# Patient Record
Sex: Female | Born: 1950
Health system: Southern US, Community
[De-identification: ages and names within clinical notes are randomized; demographics above are authoritative.]

## PROBLEM LIST (undated history)

## (undated) DIAGNOSIS — F329 Major depressive disorder, single episode, unspecified: Secondary | ICD-10-CM

## (undated) DIAGNOSIS — M797 Fibromyalgia: Secondary | ICD-10-CM

## (undated) DIAGNOSIS — J449 Chronic obstructive pulmonary disease, unspecified: Secondary | ICD-10-CM

## (undated) DIAGNOSIS — M1990 Unspecified osteoarthritis, unspecified site: Secondary | ICD-10-CM

## (undated) DIAGNOSIS — R42 Dizziness and giddiness: Secondary | ICD-10-CM

## (undated) DIAGNOSIS — F32A Depression, unspecified: Secondary | ICD-10-CM

## (undated) DIAGNOSIS — T7840XA Allergy, unspecified, initial encounter: Secondary | ICD-10-CM

## (undated) DIAGNOSIS — M810 Age-related osteoporosis without current pathological fracture: Secondary | ICD-10-CM

## (undated) DIAGNOSIS — F419 Anxiety disorder, unspecified: Secondary | ICD-10-CM

## (undated) HISTORY — DX: Age-related osteoporosis without current pathological fracture: M81.0

## (undated) HISTORY — DX: Fibromyalgia: M79.7

## (undated) HISTORY — DX: Unspecified osteoarthritis, unspecified site: M19.90

## (undated) HISTORY — DX: Chronic obstructive pulmonary disease, unspecified: J44.9

## (undated) HISTORY — DX: Depression, unspecified: F32.A

## (undated) HISTORY — DX: Dizziness and giddiness: R42

## (undated) HISTORY — DX: Major depressive disorder, single episode, unspecified: F32.9

## (undated) HISTORY — DX: Allergy, unspecified, initial encounter: T78.40XA

## (undated) HISTORY — DX: Anxiety disorder, unspecified: F41.9

---

## 1972-03-01 HISTORY — PX: TUBAL LIGATION: SHX77

## 2012-10-23 LAB — HM MAMMOGRAPHY

## 2012-11-21 DIAGNOSIS — Z8669 Personal history of other diseases of the nervous system and sense organs: Secondary | ICD-10-CM | POA: Diagnosis not present

## 2012-11-21 DIAGNOSIS — M549 Dorsalgia, unspecified: Secondary | ICD-10-CM | POA: Diagnosis not present

## 2012-11-21 DIAGNOSIS — R509 Fever, unspecified: Secondary | ICD-10-CM | POA: Diagnosis not present

## 2012-11-21 DIAGNOSIS — R05 Cough: Secondary | ICD-10-CM | POA: Diagnosis not present

## 2013-02-12 DIAGNOSIS — F17200 Nicotine dependence, unspecified, uncomplicated: Secondary | ICD-10-CM | POA: Diagnosis not present

## 2013-02-12 DIAGNOSIS — J329 Chronic sinusitis, unspecified: Secondary | ICD-10-CM | POA: Diagnosis not present

## 2013-05-07 ENCOUNTER — Ambulatory Visit (INDEPENDENT_AMBULATORY_CARE_PROVIDER_SITE_OTHER): Payer: Medicare Other

## 2013-05-07 ENCOUNTER — Ambulatory Visit (INDEPENDENT_AMBULATORY_CARE_PROVIDER_SITE_OTHER): Payer: Medicare Other | Admitting: Family Medicine

## 2013-05-07 VITALS — BP 98/45 | HR 109 | Temp 98.3°F | Ht 64.0 in | Wt 143.4 lb

## 2013-05-07 DIAGNOSIS — N390 Urinary tract infection, site not specified: Secondary | ICD-10-CM | POA: Diagnosis not present

## 2013-05-07 DIAGNOSIS — R42 Dizziness and giddiness: Secondary | ICD-10-CM

## 2013-05-07 DIAGNOSIS — R509 Fever, unspecified: Secondary | ICD-10-CM

## 2013-05-07 DIAGNOSIS — R52 Pain, unspecified: Secondary | ICD-10-CM

## 2013-05-07 LAB — POCT CBC
Granulocyte percent: 76.5 %G (ref 37–80)
HCT, POC: 44.2 % (ref 37.7–47.9)
Hemoglobin: 14.5 g/dL (ref 12.2–16.2)
Lymph, poc: 2 (ref 0.6–3.4)
MCH, POC: 30.7 pg (ref 27–31.2)
MCHC: 32.8 g/dL (ref 31.8–35.4)
MCV: 93.7 fL (ref 80–97)
MPV: 8.2 fL (ref 0–99.8)
POC Granulocyte: 7.2 — AB (ref 2–6.9)
POC LYMPH PERCENT: 21 %L (ref 10–50)
Platelet Count, POC: 279 10*3/uL (ref 142–424)
RBC: 4.7 M/uL (ref 4.04–5.48)
RDW, POC: 12.2 %
WBC: 9.4 10*3/uL (ref 4.6–10.2)

## 2013-05-07 LAB — POCT UA - MICROSCOPIC ONLY
Casts, Ur, LPF, POC: NEGATIVE
Crystals, Ur, HPF, POC: NEGATIVE
Mucus, UA: NEGATIVE
Yeast, UA: NEGATIVE

## 2013-05-07 LAB — POCT URINALYSIS DIPSTICK
Bilirubin, UA: NEGATIVE
Blood, UA: NEGATIVE
Glucose, UA: NEGATIVE
Ketones, UA: NEGATIVE
Nitrite, UA: NEGATIVE
Protein, UA: NEGATIVE
Spec Grav, UA: <=1.005
Urobilinogen, UA: NEGATIVE
pH, UA: 7.5

## 2013-05-07 LAB — POCT INFLUENZA A/B
Influenza A, POC: NEGATIVE
Influenza B, POC: NEGATIVE

## 2013-05-07 LAB — POCT UA - MICROALBUMIN

## 2013-05-07 MED ORDER — CIPROFLOXACIN HCL 500 MG PO TABS: 500.0000 mg | ORAL_TABLET | Freq: Two times a day (BID) | ORAL | Status: DC

## 2013-05-07 MED ORDER — MECLIZINE HCL 25 MG PO TABS: 25.0000 mg | ORAL_TABLET | Freq: Three times a day (TID) | ORAL | Status: DC | PRN

## 2013-05-07 NOTE — Progress Notes (Signed)
   Subjective:    Patient ID: Lindsey May, female    DOB: 1950/09/08, 63 y.o.   MRN: [id number]  HPI This 63 y.o. female presents for evaluation of fever, malaise, and cough   Review of Systems    No chest pain, SOB, HA, dizziness, vision change, N/V, diarrhea, constipation, dysuria, urinary urgency or frequency, myalgias, arthralgias or rash.  Objective:   Physical Exam  Vital signs noted  Well developed well nourished female.  HEENT - Head atraumatic Normocephalic                Eyes - PERRLA, Conjuctiva - clear Sclera- Clear EOMI                Ears - EAC's Wnl TM's Wnl Gross Hearing WNL                Nose - Nares patent                 Throat - oropharanx wnl Respiratory - Lungs CTA bilateral Cardiac - RRR S1 and S2 without murmur GI - Abdomen soft Nontender and bowel sounds active x 4 Extremities - No edema. Neuro - Grossly intact.  Results for orders placed in visit on 05/07/13  POCT INFLUENZA A/B      Result Value Ref Range   Influenza A, POC Negative     Influenza B, POC Negative    POCT UA - MICROALBUMIN      Result Value Ref Range   Microalbumin Ur, POC      POCT UA - MICROSCOPIC ONLY      Result Value Ref Range   WBC, Ur, HPF, POC -     RBC, urine, microscopic occ     Bacteria, U Microscopic occ     Mucus, UA negative     Epithelial cells, urine per micros occ     Crystals, Ur, HPF, POC negative     Casts, Ur, LPF, POC negative     Yeast, UA negative    POCT URINALYSIS DIPSTICK      Result Value Ref Range   Color, UA gold     Clarity, UA clear     Glucose, UA negative     Bilirubin, UA negative     Ketones, UA negative     Spec Grav, UA <=1.005     Blood, UA negative     pH, UA 7.5     Protein, UA negative     Urobilinogen, UA negative     Nitrite, UA negative     Leukocytes, UA large (3+)       CXR - Normal Assessment & Plan:  Fever - Plan: POCT Influenza A/B, POCT UA - Microalbumin, POCT UA - Microscopic Only, POCT CBC, CMP14+EGFR, Thyroid  Panel With TSH, DG Chest 2 View  Body aches - Plan: POCT Influenza A/B, POCT UA - Microalbumin, POCT UA - Microscopic Only, POCT CBC, CMP14+EGFR, Thyroid Panel With TSH, DG Chest 2 View.  UTI - UA cx and cipro 500 mg po bid x 10 days  Push po fluids, rest, tylenol and motrin otc prn as directed for fever, arthralgias, and myalgias.  Follow up prn if sx's continue or persist.  Vertigo - Plan: meclizine (ANTIVERT) 25 MG tablet  Lysbeth Penner FNP

## 2013-05-08 LAB — THYROID PANEL WITH TSH
Free Thyroxine Index: 2.7 (ref 1.2–4.9)
T3 Uptake Ratio: 26 % (ref 24–39)
T4, Total: 10.3 ug/dL (ref 4.5–12.0)
TSH: 0.979 u[IU]/mL (ref 0.450–4.500)

## 2013-05-08 LAB — URINE CULTURE: Organism ID, Bacteria: NO GROWTH

## 2013-05-08 LAB — CMP14+EGFR
ALT: 10 IU/L (ref 0–32)
AST: 13 IU/L (ref 0–40)
Albumin/Globulin Ratio: 2.1 (ref 1.1–2.5)
Albumin: 4.5 g/dL (ref 3.6–4.8)
Alkaline Phosphatase: 113 IU/L (ref 39–117)
BUN/Creatinine Ratio: 12 (ref 11–26)
BUN: 11 mg/dL (ref 8–27)
CO2: 27 mmol/L (ref 18–29)
Calcium: 9.6 mg/dL (ref 8.7–10.3)
Chloride: 94 mmol/L — ABNORMAL LOW (ref 97–108)
Creatinine, Ser: 0.94 mg/dL (ref 0.57–1.00)
GFR calc Af Amer: 74 mL/min/{1.73_m2} (ref >59)
GFR calc non Af Amer: 64 mL/min/{1.73_m2} (ref >59)
Globulin, Total: 2.1 g/dL (ref 1.5–4.5)
Glucose: 88 mg/dL (ref 65–99)
Potassium: 4.6 mmol/L (ref 3.5–5.2)
Sodium: 139 mmol/L (ref 134–144)
Total Bilirubin: 0.4 mg/dL (ref 0.0–1.2)
Total Protein: 6.6 g/dL (ref 6.0–8.5)

## 2013-06-18 ENCOUNTER — Ambulatory Visit (INDEPENDENT_AMBULATORY_CARE_PROVIDER_SITE_OTHER): Payer: Medicare Other | Admitting: Family Medicine

## 2013-06-18 ENCOUNTER — Encounter: Payer: Self-pay | Admitting: Family Medicine

## 2013-06-18 VITALS — BP 110/78 | HR 74 | Temp 97.6°F | Ht 64.0 in | Wt 145.8 lb

## 2013-06-18 DIAGNOSIS — J069 Acute upper respiratory infection, unspecified: Secondary | ICD-10-CM | POA: Diagnosis not present

## 2013-06-18 DIAGNOSIS — F411 Generalized anxiety disorder: Secondary | ICD-10-CM

## 2013-06-18 DIAGNOSIS — H109 Unspecified conjunctivitis: Secondary | ICD-10-CM | POA: Diagnosis not present

## 2013-06-18 MED ORDER — POLYMYXIN B-TRIMETHOPRIM 10000-0.1 UNIT/ML-% OP SOLN: 1.0000 [drp] | OPHTHALMIC | Status: DC

## 2013-06-18 MED ORDER — AMOXICILLIN 875 MG PO TABS
875.0000 mg | ORAL_TABLET | Freq: Two times a day (BID) | ORAL | Status: DC
Start: 2013-06-18 — End: 2013-07-13

## 2013-06-18 MED ORDER — LORAZEPAM 0.5 MG PO TABS: 0.5000 mg | ORAL_TABLET | Freq: Two times a day (BID) | ORAL | Status: DC | PRN

## 2013-06-18 MED ORDER — METHYLPREDNISOLONE ACETATE 80 MG/ML IJ SUSP
80.0000 mg | Freq: Once | INTRAMUSCULAR | Status: AC
  Administered 2013-06-18: 80 mg via INTRAMUSCULAR
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
## 2013-07-11 ENCOUNTER — Telehealth: Payer: Self-pay | Admitting: *Deleted

## 2013-07-11 ENCOUNTER — Other Ambulatory Visit: Payer: Self-pay | Admitting: Family Medicine

## 2013-07-11 DIAGNOSIS — R42 Dizziness and giddiness: Secondary | ICD-10-CM

## 2013-07-11 MED ORDER — MECLIZINE HCL 25 MG PO TABS: 25.0000 mg | ORAL_TABLET | Freq: Three times a day (TID) | ORAL | Status: DC | PRN

## 2013-07-11 NOTE — Telephone Encounter (Signed)
Patient wants to know if we can refill on prescription for antivert

## 2013-07-13 ENCOUNTER — Ambulatory Visit (INDEPENDENT_AMBULATORY_CARE_PROVIDER_SITE_OTHER): Payer: Medicare Other | Admitting: Family Medicine

## 2013-07-13 ENCOUNTER — Encounter: Payer: Self-pay | Admitting: Family Medicine

## 2013-07-13 VITALS — BP 102/69 | HR 77 | Temp 98.8°F | Ht 64.0 in | Wt 144.0 lb

## 2013-07-13 DIAGNOSIS — K130 Diseases of lips: Secondary | ICD-10-CM

## 2013-07-13 DIAGNOSIS — H109 Unspecified conjunctivitis: Secondary | ICD-10-CM | POA: Diagnosis not present

## 2013-07-13 DIAGNOSIS — R42 Dizziness and giddiness: Secondary | ICD-10-CM | POA: Diagnosis not present

## 2013-07-13 DIAGNOSIS — J069 Acute upper respiratory infection, unspecified: Secondary | ICD-10-CM | POA: Diagnosis not present

## 2013-07-13 MED ORDER — NYSTATIN 100000 UNIT/GM EX OINT: 1.0000 "application " | TOPICAL_OINTMENT | Freq: Two times a day (BID) | CUTANEOUS | Status: DC

## 2013-07-13 MED ORDER — METHYLPREDNISOLONE ACETATE 80 MG/ML IJ SUSP
80.0000 mg | Freq: Once | INTRAMUSCULAR | Status: AC
  Administered 2013-07-13: 80 mg via INTRAMUSCULAR

## 2013-07-13 MED ORDER — MECLIZINE HCL 25 MG PO TABS: 25.0000 mg | ORAL_TABLET | Freq: Three times a day (TID) | ORAL | Status: DC | PRN

## 2013-07-13 MED ORDER — AMOXICILLIN 875 MG PO TABS: 875.0000 mg | ORAL_TABLET | Freq: Two times a day (BID) | ORAL | Status: DC

## 2013-07-13 NOTE — Progress Notes (Signed)
   Subjective:    Patient ID: Lindsey LevelAlma May, female    DOB: 11/25/1950, 63 y.o.   MRN: [id number]  HPI This 63 y.o. female presents for evaluation of URI sx's, dizziness, and c/o rash on edge of mouth that has been persistent.    Review of Systems C/o uri sx's and rash No chest pain, SOB, HA, dizziness, vision change, N/V, diarrhea, constipation, dysuria, urinary urgency or frequency, myalgias, arthralgias or rash.     Objective:   Physical Exam Vital signs noted  Well developed well nourished female.  HEENT - Head atraumatic Normocephalic                Eyes - PERRLA, Conjuctiva - clear Sclera- Clear EOMI                Ears - EAC's Wnl TM's Wnl Gross Hearing WNL                Throat - oropharanx wnl Respiratory - Lungs CTA bilateral Cardiac - RRR S1 and S2 without murmur GI - Abdomen soft Nontender and bowel sounds active x 4 Extremities - No edema. Neuro - Grossly intact. Skin - rash on edge of mouth      Assessment & Plan:  URI (upper respiratory infection) - Plan: amoxicillin (AMOXIL) 875 MG tablet, methylPREDNISolone acetate (DEPO-MEDROL) injection 80 mg  Vertigo - Plan: meclizine (ANTIVERT) 25 MG tablet  Angular cheilitis - Plan: nystatin ointment (MYCOSTATIN) apply bid  Deatra CanterWilliam J Weda Baumgarner FNP

## 2013-07-19 NOTE — Telephone Encounter (Signed)
Patient seen on 5/15

## 2013-08-17 ENCOUNTER — Other Ambulatory Visit: Payer: Self-pay | Admitting: Nurse Practitioner

## 2013-08-17 ENCOUNTER — Ambulatory Visit (INDEPENDENT_AMBULATORY_CARE_PROVIDER_SITE_OTHER): Payer: Medicare Other | Admitting: Nurse Practitioner

## 2013-08-17 VITALS — BP 118/78 | HR 76 | Temp 98.3°F | Ht 64.0 in | Wt 142.6 lb

## 2013-08-17 DIAGNOSIS — R42 Dizziness and giddiness: Secondary | ICD-10-CM

## 2013-08-17 DIAGNOSIS — J3089 Other allergic rhinitis: Secondary | ICD-10-CM | POA: Diagnosis not present

## 2013-08-17 DIAGNOSIS — M545 Low back pain, unspecified: Secondary | ICD-10-CM

## 2013-08-17 DIAGNOSIS — J302 Other seasonal allergic rhinitis: Secondary | ICD-10-CM

## 2013-08-17 MED ORDER — MECLIZINE HCL 25 MG PO TABS: 25.0000 mg | ORAL_TABLET | Freq: Three times a day (TID) | ORAL | Status: DC | PRN

## 2013-08-17 MED ORDER — METHYLPREDNISOLONE ACETATE 80 MG/ML IJ SUSP
80.0000 mg | Freq: Once | INTRAMUSCULAR | Status: AC
  Administered 2013-08-17: 80 mg via INTRAMUSCULAR

## 2013-08-17 NOTE — Progress Notes (Signed)
   Subjective:    Patient ID: Lindsey LevelAlma May, female    DOB: Jul 26, 1950, 63 y.o.   MRN: [id number]  HPI Patient in c/o: - headache and right ear pain- started about 2-3 days ago- she denies fever. -Back pain started 1 week ago- intermittent- sitting makes it worse and walking relieves pain- denies any injury- Pian radiates down the back of her left leg- denies any weakness. Rates pain 6-7/10 currently and took tylenol about 30  Minutes ago.    Review of Systems  Constitutional: Negative for fever and chills.  HENT: Positive for congestion, ear pain (right) and sneezing.   Respiratory: Negative.   Cardiovascular: Negative.   Gastrointestinal: Negative.   Genitourinary: Negative.   Musculoskeletal: Positive for back pain.  Neurological: Negative.   Psychiatric/Behavioral: Negative.   All other systems reviewed and are negative.      Objective:   Physical Exam  Constitutional: She is oriented to person, place, and time. She appears well-developed and well-nourished.  HENT:  Right Ear: Hearing, tympanic membrane, external ear and ear canal normal.  Left Ear: Hearing, tympanic membrane, external ear and ear canal normal.  Nose: Mucosal edema and rhinorrhea present. Right sinus exhibits no maxillary sinus tenderness and no frontal sinus tenderness. Left sinus exhibits no maxillary sinus tenderness and no frontal sinus tenderness.  Mouth/Throat: Uvula is midline, oropharynx is clear and moist and mucous membranes are normal.  Cardiovascular: Normal rate, regular rhythm and normal heart sounds.   Pulmonary/Chest: Effort normal and breath sounds normal.  Abdominal: Soft. Bowel sounds are normal.  Musculoskeletal:  FROM of lumbar spine - withpain on palpation left lower back (-) SLR bil Motor strength and sensation distally intact  Neurological: She is alert and oriented to person, place, and time. She has normal reflexes.  Skin: Skin is warm and dry.  Psychiatric: She has a normal mood  and affect. Her behavior is normal. Judgment and thought content normal.   BP 118/78  Pulse 76  Temp(Src) 98.3 F (36.8 C) (Oral)  Ht 5\' 4"  (1.626 m)  Wt 142 lb 9.6 oz (64.683 kg)  BMI 24.47 kg/m2        Assessment & Plan:  1. Left-sided low back pain without sciatica Moist heat ICY hot OTC Back stretching exercises - methylPREDNISolone acetate (DEPO-MEDROL) injection 80 mg; Inject 1 mL (80 mg total) into the muscle once.  2. Other seasonal allergic rhinitis Prednisone injection will help Continue OTC nasal sray Zyrtec if helps Force fluids RTO prn  Mary-Margaret Daphine DeutscherMartin, FNP

## 2013-08-17 NOTE — Patient Instructions (Signed)

## 2013-08-23 ENCOUNTER — Encounter: Payer: Self-pay | Admitting: Family Medicine

## 2013-08-23 ENCOUNTER — Ambulatory Visit (INDEPENDENT_AMBULATORY_CARE_PROVIDER_SITE_OTHER): Payer: Medicare Other | Admitting: Family Medicine

## 2013-08-23 VITALS — BP 125/58 | HR 89 | Temp 97.2°F

## 2013-08-23 DIAGNOSIS — M545 Low back pain, unspecified: Secondary | ICD-10-CM

## 2013-08-23 DIAGNOSIS — F411 Generalized anxiety disorder: Secondary | ICD-10-CM

## 2013-08-23 DIAGNOSIS — F3289 Other specified depressive episodes: Secondary | ICD-10-CM

## 2013-08-23 DIAGNOSIS — J069 Acute upper respiratory infection, unspecified: Secondary | ICD-10-CM

## 2013-08-23 DIAGNOSIS — F329 Major depressive disorder, single episode, unspecified: Secondary | ICD-10-CM | POA: Diagnosis not present

## 2013-08-23 DIAGNOSIS — F32A Depression, unspecified: Secondary | ICD-10-CM

## 2013-08-23 MED ORDER — CYCLOBENZAPRINE HCL 10 MG PO TABS: 10.0000 mg | ORAL_TABLET | Freq: Three times a day (TID) | ORAL | Status: DC | PRN

## 2013-08-23 MED ORDER — NAPROXEN 500 MG PO TABS: 500.0000 mg | ORAL_TABLET | Freq: Two times a day (BID) | ORAL | Status: DC

## 2013-08-23 MED ORDER — LORAZEPAM 0.5 MG PO TABS: 0.5000 mg | ORAL_TABLET | Freq: Three times a day (TID) | ORAL | Status: DC | PRN

## 2013-08-23 MED ORDER — SERTRALINE HCL 50 MG PO TABS: 50.0000 mg | ORAL_TABLET | Freq: Every day | ORAL | Status: DC

## 2013-08-23 MED ORDER — AMOXICILLIN 875 MG PO TABS: 875.0000 mg | ORAL_TABLET | Freq: Two times a day (BID) | ORAL | Status: DC

## 2013-08-23 NOTE — Progress Notes (Signed)
   Subjective:    Patient ID: Lindsey May, female    DOB: [birth year], 63 y.o.   MRN: [id number]  HPI This 63 y.o. female presents for evaluation of c/o back pain and depression sx's.  She states her Son is on dialysis and he is dying from liver cancer.  She needs refills on her ativan.   Review of Systems C/o back pain and depression   No chest pain, SOB, HA, dizziness, vision change, N/V, diarrhea, constipation, dysuria, urinary urgency or frequency, myalgias, arthralgias or rash.  Objective:   Physical Exam Vital signs noted  Anxious appearing female.  HEENT - Head atraumatic Normocephalic                Eyes - PERRLA, Conjuctiva - clear Sclera- Clear EOMI                Ears - EAC's Wnl TM's Wnl Gross Hearing WNL                Nose - Nares patent                 Throat - oropharanx wnl Respiratory - Lungs CTA bilateral Cardiac - RRR S1 and S2 without murmur GI - Abdomen soft Nontender and bowel sounds active x 4 Extremities - No edema. Neuro - Grossly intact. MS - TTP bilateral lumbar paraspinous muscles      Assessment & Plan:  URI (upper respiratory infection) - Plan: amoxicillin (AMOXIL) 875 MG tablet  Anxiety state, unspecified - Plan: LORazepam (ATIVAN) 0.5 MG tablet, sertraline (ZOLOFT) 50 MG tablet  Depression - Plan: LORazepam (ATIVAN) 0.5 MG tablet, sertraline (ZOLOFT) 50 MG tablet  Back pain at L4-L5 level - Plan: naproxen (NAPROSYN) 500 MG tablet, cyclobenzaprine (FLEXERIL) 10 MG tablet  Follow up in 3 months and prn  Deatra CanterWilliam J Oxford FNP

## 2013-10-02 ENCOUNTER — Other Ambulatory Visit: Payer: Self-pay | Admitting: Nurse Practitioner

## 2013-10-22 ENCOUNTER — Encounter: Payer: Self-pay | Admitting: Family Medicine

## 2013-10-22 ENCOUNTER — Ambulatory Visit (INDEPENDENT_AMBULATORY_CARE_PROVIDER_SITE_OTHER): Payer: Medicare Other | Admitting: Family Medicine

## 2013-10-22 VITALS — BP 108/63 | HR 85 | Temp 97.7°F | Ht 64.0 in | Wt 143.6 lb

## 2013-10-22 DIAGNOSIS — N390 Urinary tract infection, site not specified: Secondary | ICD-10-CM

## 2013-10-22 DIAGNOSIS — R350 Frequency of micturition: Secondary | ICD-10-CM | POA: Diagnosis not present

## 2013-10-22 DIAGNOSIS — J208 Acute bronchitis due to other specified organisms: Secondary | ICD-10-CM

## 2013-10-22 DIAGNOSIS — R300 Dysuria: Secondary | ICD-10-CM

## 2013-10-22 DIAGNOSIS — J209 Acute bronchitis, unspecified: Secondary | ICD-10-CM | POA: Diagnosis not present

## 2013-10-22 LAB — POCT URINALYSIS DIPSTICK
Bilirubin, UA: NEGATIVE
Blood, UA: NEGATIVE
Glucose, UA: NEGATIVE
Ketones, UA: NEGATIVE
Nitrite, UA: NEGATIVE
Protein, UA: NEGATIVE
Spec Grav, UA: <=1.005
Urobilinogen, UA: NEGATIVE
pH, UA: 7.5

## 2013-10-22 LAB — POCT UA - MICROSCOPIC ONLY
Bacteria, U Microscopic: NEGATIVE
Casts, Ur, LPF, POC: NEGATIVE
Crystals, Ur, HPF, POC: NEGATIVE
Mucus, UA: NEGATIVE
RBC, urine, microscopic: NEGATIVE
Yeast, UA: NEGATIVE

## 2013-10-22 MED ORDER — ALBUTEROL SULFATE (2.5 MG/3ML) 0.083% IN NEBU: 2.5000 mg | INHALATION_SOLUTION | Freq: Four times a day (QID) | RESPIRATORY_TRACT | Status: DC | PRN

## 2013-10-22 MED ORDER — METHYLPREDNISOLONE (PAK) 4 MG PO TABS: ORAL_TABLET | ORAL | Status: DC

## 2013-10-22 MED ORDER — MECLIZINE HCL 25 MG PO TABS: 25.0000 mg | ORAL_TABLET | Freq: Three times a day (TID) | ORAL | Status: DC | PRN

## 2013-10-22 MED ORDER — POLYMYXIN B-TRIMETHOPRIM 10000-0.1 UNIT/ML-% OP SOLN: 2.0000 [drp] | OPHTHALMIC | Status: DC

## 2013-10-22 MED ORDER — METHYLPREDNISOLONE ACETATE 80 MG/ML IJ SUSP
80.0000 mg | Freq: Once | INTRAMUSCULAR | Status: AC
Start: 2013-10-22 — End: 2013-10-22
  Administered 2013-10-22: 80 mg via INTRAMUSCULAR

## 2013-10-22 MED ORDER — CIPROFLOXACIN HCL 500 MG PO TABS: 500.0000 mg | ORAL_TABLET | Freq: Two times a day (BID) | ORAL | Status: DC

## 2013-10-22 NOTE — Progress Notes (Signed)
   Subjective:    Patient ID: Lindsey May, female    DOB: 06-Nov-1950, 63 y.o.   MRN: [id number]  HPI  This 63 y.o. female presents for evaluation of cough, congestion, uri sx's, and dysuria.  Review of Systems    No chest pain, SOB, HA, dizziness, vision change, N/V, diarrhea, constipation, dysuria, urinary urgency or frequency, myalgias, arthralgias or rash.  Objective:   Physical Exam Vital signs noted  Well developed well nourished female.  HEENT - Head atraumatic Normocephalic                Eyes - PERRLA, Conjuctiva - clear Sclera- Clear EOMI                Ears - EAC's Wnl TM's Wnl Gross Hearing WNL                Nose - Nares patent                 Throat - oropharanx wnl Respiratory - Lungs CTA bilateral Cardiac - RRR S1 and S2 without murmur GI - Abdomen soft Nontender and bowel sounds active x 4 Extremities - No edema. Neuro - Grossly intact.   Results for orders placed in visit on 10/22/13  POCT UA - MICROSCOPIC ONLY      Result Value Ref Range   WBC, Ur, HPF, POC 15-20     RBC, urine, microscopic neg     Bacteria, U Microscopic neg     Mucus, UA neg     Epithelial cells, urine per micros occ     Crystals, Ur, HPF, POC neg     Casts, Ur, LPF, POC neg     Yeast, UA neg    POCT URINALYSIS DIPSTICK      Result Value Ref Range   Color, UA yellow     Clarity, UA clear     Glucose, UA neg     Bilirubin, UA neg     Ketones, UA neg     Spec Grav, UA <=1.005     Blood, UA neg     pH, UA 7.5     Protein, UA neg     Urobilinogen, UA negative     Nitrite, UA neg     Leukocytes, UA moderate (2+)        Assessment & Plan:  Dysuria - Plan: POCT UA - Microscopic Only, POCT urinalysis dipstick  Urinary frequency - Plan: POCT UA - Microscopic Only, POCT urinalysis dipstick  UTI (lower urinary tract infection) - Plan: ciprofloxacin (CIPRO) 500 MG tablet, Urine culture  Acute bronchitis due to other specified organisms - Plan: methylPREDNIsolone (MEDROL DOSPACK)  4 MG tablet, ciprofloxacin (CIPRO) 500 MG tablet, meclizine (ANTIVERT) 25 MG tablet, trimethoprim-polymyxin b (POLYTRIM) ophthalmic solution, albuterol (PROVENTIL) (2.5 MG/3ML) 0.083% nebulizer solution, methylPREDNISolone acetate (DEPO-MEDROL) injection 80 mg  Deatra Canter FNP

## 2013-10-24 LAB — URINE CULTURE

## 2013-10-25 ENCOUNTER — Telehealth: Payer: Self-pay | Admitting: Family Medicine

## 2013-10-25 NOTE — Telephone Encounter (Signed)
Message copied by Doreatha Massed on Thu Oct 25, 2013  3:25 PM ------      Message from: Deatra Canter      Created: Thu Oct 25, 2013  1:58 PM       Continue cipro and sensitive on UA cx which is positive for UTI ------

## 2013-10-30 ENCOUNTER — Telehealth: Payer: Self-pay | Admitting: Family Medicine

## 2013-10-30 NOTE — Telephone Encounter (Signed)
Message copied by Doreatha Massed on Tue Oct 30, 2013  3:20 PM ------      Message from: Deatra Canter      Created: Thu Oct 25, 2013  1:58 PM       Continue cipro and sensitive on UA cx which is positive for UTI ------

## 2013-10-31 ENCOUNTER — Encounter: Payer: Self-pay | Admitting: Family Medicine

## 2013-11-07 NOTE — Telephone Encounter (Signed)
Message copied by Doreatha Massed on Wed Nov 07, 2013 11:20 AM ------      Message from: Deatra Canter      Created: Thu Oct 25, 2013  1:58 PM       Continue cipro and sensitive on UA cx which is positive for UTI ------

## 2013-11-08 ENCOUNTER — Telehealth: Payer: Self-pay | Admitting: *Deleted

## 2013-11-08 NOTE — Telephone Encounter (Signed)
Pt needed rx for nebulizer machine RX was given for Albuterol solution at office visit on 8/24 RX for nebulizer printed and to the front for pt pick up

## 2013-11-12 ENCOUNTER — Telehealth: Payer: Self-pay | Admitting: Family Medicine

## 2013-11-14 NOTE — Telephone Encounter (Signed)
Na ac 

## 2013-11-14 NOTE — Telephone Encounter (Signed)
Prednisone is something that should be used sparingly and judiciously and probably should follow up if they thing they need more prednisone.

## 2013-12-13 ENCOUNTER — Telehealth: Payer: Self-pay | Admitting: Family Medicine

## 2013-12-13 ENCOUNTER — Encounter: Payer: Self-pay | Admitting: Family Medicine

## 2013-12-13 ENCOUNTER — Ambulatory Visit (INDEPENDENT_AMBULATORY_CARE_PROVIDER_SITE_OTHER): Payer: Medicare Other | Admitting: Family Medicine

## 2013-12-13 VITALS — BP 123/70 | HR 98 | Temp 97.5°F | Ht 64.0 in | Wt 144.4 lb

## 2013-12-13 DIAGNOSIS — F411 Generalized anxiety disorder: Secondary | ICD-10-CM

## 2013-12-13 DIAGNOSIS — F329 Major depressive disorder, single episode, unspecified: Secondary | ICD-10-CM | POA: Diagnosis not present

## 2013-12-13 DIAGNOSIS — R21 Rash and other nonspecific skin eruption: Secondary | ICD-10-CM

## 2013-12-13 DIAGNOSIS — J208 Acute bronchitis due to other specified organisms: Secondary | ICD-10-CM | POA: Diagnosis not present

## 2013-12-13 DIAGNOSIS — F32A Depression, unspecified: Secondary | ICD-10-CM

## 2013-12-13 MED ORDER — LORAZEPAM 0.5 MG PO TABS
0.5000 mg | ORAL_TABLET | Freq: Three times a day (TID) | ORAL | Status: DC | PRN
Start: 2013-12-13 — End: 2014-04-29

## 2013-12-13 MED ORDER — PREDNISONE 10 MG PO TABS: ORAL_TABLET | ORAL | Status: DC

## 2013-12-13 MED ORDER — SULFAMETHOXAZOLE-TMP DS 800-160 MG PO TABS: 1.0000 | ORAL_TABLET | Freq: Two times a day (BID) | ORAL | Status: DC

## 2013-12-13 MED ORDER — CEFTRIAXONE SODIUM 1 G IJ SOLR
1.0000 g | INTRAMUSCULAR | Status: AC
  Administered 2013-12-13: 1 g via INTRAMUSCULAR

## 2013-12-13 MED ORDER — MECLIZINE HCL 25 MG PO TABS: 25.0000 mg | ORAL_TABLET | Freq: Three times a day (TID) | ORAL | Status: DC | PRN

## 2013-12-13 MED ORDER — METHYLPREDNISOLONE ACETATE 80 MG/ML IJ SUSP
80.0000 mg | Freq: Once | INTRAMUSCULAR | Status: AC
  Administered 2013-12-13: 80 mg via INTRAMUSCULAR

## 2013-12-13 MED ORDER — NYSTATIN 100000 UNIT/GM EX CREA: 1.0000 "application " | TOPICAL_CREAM | Freq: Two times a day (BID) | CUTANEOUS | Status: DC

## 2013-12-13 MED ORDER — ALBUTEROL SULFATE HFA 108 (90 BASE) MCG/ACT IN AERS: 2.0000 | INHALATION_SPRAY | Freq: Four times a day (QID) | RESPIRATORY_TRACT | Status: DC | PRN

## 2013-12-13 MED ORDER — ALBUTEROL SULFATE (2.5 MG/3ML) 0.083% IN NEBU: 2.5000 mg | INHALATION_SOLUTION | Freq: Four times a day (QID) | RESPIRATORY_TRACT | Status: DC | PRN

## 2013-12-13 NOTE — Telephone Encounter (Signed)
Patient given appt for today.

## 2013-12-13 NOTE — Progress Notes (Signed)
   Subjective:    Patient ID: Lindsey May, female    DOB: 01-27-1951, 63 y.o.   MRN: [id number]  HPI This 63 y.o. female presents for evaluation of shortness of breath and bronchitis sx's.  She has hx of COPD and she has been having more SOB and wheezing especially at night.   Review of Systems No chest pain, SOB, HA, dizziness, vision change, N/V, diarrhea, constipation, dysuria, urinary urgency or frequency, myalgias, arthralgias or rash.     Objective:   Physical Exam   Vital signs noted  Well developed well nourished female.  HEENT - Head atraumatic Normocephalic                Eyes - PERRLA, Conjuctiva - clear Sclera- Clear EOMI                Ears - EAC's Wnl TM's Wnl Gross Hearing WNL                Nose - Nares patent                 Throat - oropharanx wnl Respiratory - Lungs CTA bilateral Cardiac - RRR S1 and S2 without murmur GI - Abdomen soft Nontender and bowel sounds active x 4 Extremities - No edema. Neuro - Grossly intact. Skin - edge of mouth with angular cheilitis.      Assessment & Plan:  GAD (generalized anxiety disorder) - Plan: LORazepam (ATIVAN) 0.5 MG tablet  Depression - Plan: LORazepam (ATIVAN) 0.5 MG tablet  Acute bronchitis due to other specified organisms - Plan: albuterol (PROVENTIL HFA;VENTOLIN HFA) 108 (90 BASE) MCG/ACT inhaler, albuterol (PROVENTIL) (2.5 MG/3ML) 0.083% nebulizer solution, predniSONE (DELTASONE) 10 MG tablet, cefTRIAXone (ROCEPHIN) injection 1 g, methylPREDNISolone acetate (DEPO-MEDROL) injection 80 mg, meclizine (ANTIVERT) 25 MG tablet, sulfamethoxazole-trimethoprim (BACTRIM DS) 800-160 MG per tablet  Rash and nonspecific skin eruption - Plan: nystatin cream (MYCOSTATIN)  Deatra CanterWilliam J Oxford FNP

## 2014-01-22 ENCOUNTER — Other Ambulatory Visit: Payer: Self-pay | Admitting: Family Medicine

## 2014-01-22 NOTE — Telephone Encounter (Signed)
Duplicated

## 2014-01-22 NOTE — Telephone Encounter (Signed)
Why does patien need these meds- these are not daily meds they are rn- needs to be seen for these

## 2014-01-22 NOTE — Telephone Encounter (Signed)
Patient aware these are not regular refills.    She assumed she would stay on them after seeing B. Oxford for a sick visit for COPD.

## 2014-02-06 ENCOUNTER — Telehealth: Payer: Self-pay | Admitting: Family Medicine

## 2014-02-06 NOTE — Telephone Encounter (Signed)
Patient advised to go to urgent care for fever, and achy if no better she was told to call Friday morning for an appointment.

## 2014-02-11 ENCOUNTER — Ambulatory Visit (INDEPENDENT_AMBULATORY_CARE_PROVIDER_SITE_OTHER): Payer: Medicare Other | Admitting: Family Medicine

## 2014-02-11 ENCOUNTER — Encounter: Payer: Self-pay | Admitting: Family Medicine

## 2014-02-11 VITALS — BP 115/60 | HR 84 | Temp 97.5°F | Ht 64.0 in | Wt 144.0 lb

## 2014-02-11 DIAGNOSIS — N390 Urinary tract infection, site not specified: Secondary | ICD-10-CM

## 2014-02-11 DIAGNOSIS — J206 Acute bronchitis due to rhinovirus: Secondary | ICD-10-CM | POA: Diagnosis not present

## 2014-02-11 DIAGNOSIS — J208 Acute bronchitis due to other specified organisms: Secondary | ICD-10-CM

## 2014-02-11 DIAGNOSIS — R05 Cough: Secondary | ICD-10-CM

## 2014-02-11 LAB — POCT INFLUENZA A/B
Influenza A, POC: NEGATIVE
Influenza B, POC: NEGATIVE

## 2014-02-11 MED ORDER — PREDNISONE 10 MG PO TABS: ORAL_TABLET | ORAL | Status: DC

## 2014-02-11 MED ORDER — FLUTICASONE-SALMETEROL 500-50 MCG/DOSE IN AEPB: 1.0000 | INHALATION_SPRAY | Freq: Two times a day (BID) | RESPIRATORY_TRACT | Status: DC

## 2014-02-11 MED ORDER — CIPROFLOXACIN HCL 500 MG PO TABS: 500.0000 mg | ORAL_TABLET | Freq: Two times a day (BID) | ORAL | Status: DC

## 2014-02-11 MED ORDER — BENZONATATE 100 MG PO CAPS: 100.0000 mg | ORAL_CAPSULE | Freq: Three times a day (TID) | ORAL | Status: DC | PRN

## 2014-02-11 NOTE — Progress Notes (Signed)
   Subjective:    Patient ID: Lindsey May, female    DOB: [birth year], 63 y.o.   MRN: [id number]  HPI C/o shortness of breath and uri sx's.  She states she has been having a lot of coughing and uri sx's.  She has hx of long time smoking.  She is feeling washed out.  Review of Systems  Constitutional: Negative for fever.  HENT: Negative for ear pain.   Eyes: Negative for discharge.  Respiratory: Negative for cough.   Cardiovascular: Negative for chest pain.  Gastrointestinal: Negative for abdominal distention.  Endocrine: Negative for polyuria.  Genitourinary: Negative for difficulty urinating.  Musculoskeletal: Negative for gait problem and neck pain.  Skin: Negative for color change and rash.  Neurological: Negative for speech difficulty and headaches.  Psychiatric/Behavioral: Negative for agitation.       Objective:    BP 115/60 mmHg  Pulse 84  Temp(Src) 97.5 F (36.4 C) (Oral)  Ht 5\' 4"  (1.626 m)  Wt 144 lb (65.318 kg)  BMI 24.71 kg/m2 Physical Exam  Constitutional: She is oriented to person, place, and time. She appears well-developed and well-nourished.  HENT:  Head: Normocephalic and atraumatic.  Mouth/Throat: Oropharynx is clear and moist.  Eyes: Pupils are equal, round, and reactive to light.  Neck: Normal range of motion. Neck supple.  Cardiovascular: Normal rate and regular rhythm.   No murmur heard. Pulmonary/Chest: Effort normal and breath sounds normal.  Abdominal: Soft. Bowel sounds are normal. There is no tenderness.  Neurological: She is alert and oriented to person, place, and time.  Skin: Skin is warm and dry.  Psychiatric: She has a normal mood and affect.          Assessment & Plan:     ICD-9-CM ICD-10-CM   1. Acute bronchitis due to other specified organisms 466.0 J20.8 predniSONE (DELTASONE) 10 MG tablet     ciprofloxacin (CIPRO) 500 MG tablet     POCT Influenza A/B  2. UTI (lower urinary tract infection) 599.0 N39.0 ciprofloxacin (CIPRO)  500 MG tablet  3. Acute bronchitis due to Rhinovirus 466.0 J20.6 predniSONE (DELTASONE) 10 MG tablet   079.3  ciprofloxacin (CIPRO) 500 MG tablet     POCT Influenza A/B     Fluticasone-Salmeterol (ADVAIR DISKUS) 500-50 MCG/DOSE AEPB     benzonatate (TESSALON PERLES) 100 MG capsule     Arthritis Panel     No Follow-up on file.  Deatra CanterWilliam J Oxford FNP

## 2014-03-26 ENCOUNTER — Ambulatory Visit: Payer: Medicare Other | Admitting: Family

## 2014-04-01 ENCOUNTER — Ambulatory Visit (INDEPENDENT_AMBULATORY_CARE_PROVIDER_SITE_OTHER): Payer: Medicare Other | Admitting: Family

## 2014-04-01 ENCOUNTER — Encounter (INDEPENDENT_AMBULATORY_CARE_PROVIDER_SITE_OTHER): Payer: Self-pay

## 2014-04-01 ENCOUNTER — Encounter: Payer: Self-pay | Admitting: Family

## 2014-04-01 VITALS — BP 127/74 | HR 81 | Temp 96.9°F | Ht 64.0 in | Wt 143.6 lb

## 2014-04-01 DIAGNOSIS — J449 Chronic obstructive pulmonary disease, unspecified: Secondary | ICD-10-CM | POA: Insufficient documentation

## 2014-04-01 DIAGNOSIS — Z1321 Encounter for screening for nutritional disorder: Secondary | ICD-10-CM | POA: Diagnosis not present

## 2014-04-01 DIAGNOSIS — J329 Chronic sinusitis, unspecified: Secondary | ICD-10-CM

## 2014-04-01 DIAGNOSIS — Z0000 Encounter for general adult medical examination without abnormal findings: Secondary | ICD-10-CM

## 2014-04-01 DIAGNOSIS — F411 Generalized anxiety disorder: Secondary | ICD-10-CM | POA: Insufficient documentation

## 2014-04-01 DIAGNOSIS — J208 Acute bronchitis due to other specified organisms: Secondary | ICD-10-CM

## 2014-04-01 MED ORDER — AMOXICILLIN-POT CLAVULANATE 875-125 MG PO TABS: 1.0000 | ORAL_TABLET | Freq: Two times a day (BID) | ORAL | Status: DC

## 2014-04-01 MED ORDER — ALBUTEROL SULFATE (2.5 MG/3ML) 0.083% IN NEBU: 2.5000 mg | INHALATION_SOLUTION | Freq: Four times a day (QID) | RESPIRATORY_TRACT | Status: DC | PRN

## 2014-04-01 MED ORDER — BENZONATATE 200 MG PO CAPS: 200.0000 mg | ORAL_CAPSULE | Freq: Three times a day (TID) | ORAL | Status: DC | PRN

## 2014-04-01 NOTE — Progress Notes (Signed)
Subjective:    Patient ID: Lindsey May, female    DOB: 1950/06/17, 64 y.o.   MRN: [id number]  Anxiety Presents for follow-up visit. Patient reports no depressed mood, dry mouth, excessive worry, insomnia, irritability, nervous/anxious behavior, palpitations, panic or shortness of breath. Symptoms occur rarely. The severity of symptoms is mild. The quality of sleep is good.   Her past medical history is significant for anxiety/panic attacks. There is no history of depression. Past treatments include SSRIs and benzodiazephines. Compliance with prior treatments has been good.  Sinusitis This is a new problem. The current episode started 1 to 4 weeks ago. The problem is unchanged. There has been no fever. Her pain is at a severity of 8/10. The pain is moderate. Associated symptoms include congestion, coughing, ear pain, a hoarse voice, sinus pressure, sneezing and a sore throat. Pertinent negatives include no headaches or shortness of breath. Past treatments include lying down. The treatment provided mild relief.  COPD Pt currently taking advair daily and albuterol BID. Pt educated on only using the albuterol as needed. Pt states she has been using it more lately though because she has been sick.    Review of Systems  Constitutional: Negative.  Negative for irritability.  HENT: Positive for congestion, ear pain, hoarse voice, sinus pressure, sneezing and sore throat.   Eyes: Negative.   Respiratory: Positive for cough. Negative for shortness of breath.   Cardiovascular: Negative.  Negative for palpitations.  Gastrointestinal: Negative.   Endocrine: Negative.   Genitourinary: Negative.   Musculoskeletal: Negative.   Neurological: Negative.  Negative for headaches.  Hematological: Negative.   Psychiatric/Behavioral: Negative.  The patient is not nervous/anxious and does not have insomnia.   All other systems reviewed and are negative.      Objective:   Physical Exam  Constitutional:  She is oriented to person, place, and time. She appears well-developed and well-nourished. No distress.  HENT:  Head: Normocephalic and atraumatic.  Right Ear: External ear normal.  Left Ear: External ear normal.  Nose: Right sinus exhibits maxillary sinus tenderness and frontal sinus tenderness. Left sinus exhibits maxillary sinus tenderness and frontal sinus tenderness.  Nasal passage erythemas with mild swelling  Oropharynx erythemas   Eyes: Pupils are equal, round, and reactive to light.  Neck: Normal range of motion. Neck supple. No thyromegaly present.  Cardiovascular: Normal rate, regular rhythm, normal heart sounds and intact distal pulses.   No murmur heard. Pulmonary/Chest: Effort normal and breath sounds normal. No respiratory distress. She has no wheezes.  Abdominal: Soft. Bowel sounds are normal. She exhibits no distension. There is no tenderness.  Musculoskeletal: Normal range of motion. She exhibits no edema or tenderness.  Neurological: She is alert and oriented to person, place, and time. She has normal reflexes. No cranial nerve deficit.  Skin: Skin is warm and dry.  Psychiatric: She has a normal mood and affect. Her behavior is normal. Judgment and thought content normal.  Vitals reviewed.     BP 127/74 mmHg  Pulse 81  Temp(Src) 96.9 F (36.1 C) (Oral)  Ht $R'5\' 4"'wq$  (1.626 m)  Wt 143 lb 9.6 oz (65.137 kg)  BMI 24.64 kg/m2     Assessment & Plan:  1. GAD (generalized anxiety disorder) - CMP14+EGFR  2. Chronic obstructive pulmonary disease, unspecified COPD, unspecified chronic bronchitis type - CMP14+EGFR  3. Acute bronchitis due to other specified organisms - CMP14+EGFR - albuterol (PROVENTIL) (2.5 MG/3ML) 0.083% nebulizer solution; Take 3 mLs (2.5 mg total) by nebulization every 6 (  six) hours as needed for wheezing or shortness of breath.  Dispense: 150 mL; Refill: 1  4. Sinusitis, unspecified chronicity, unspecified location - amoxicillin-clavulanate  (AUGMENTIN) 875-125 MG per tablet; Take 1 tablet by mouth 2 (two) times daily.  Dispense: 14 tablet; Refill: 0 - benzonatate (TESSALON) 200 MG capsule; Take 1 capsule (200 mg total) by mouth 3 (three) times daily as needed.  Dispense: 30 capsule; Refill: 1  5. Encounter for vitamin deficiency screening - Vit D  25 hydroxy (rtn osteoporosis monitoring)  6. Physical exam - CMP14+EGFR - Lipid panel - Vit D  25 hydroxy (rtn osteoporosis monitoring)   Continue all meds Labs pending Health Maintenance reviewed Diet and exercise encouraged RTO 6 months   Evelina Dun, FNP

## 2014-04-01 NOTE — Patient Instructions (Addendum)
Health Maintenance Adopting a healthy lifestyle and getting preventive care can go a long way to promote health and wellness. Talk with your health care provider about what schedule of regular examinations is right for you. This is a good chance for you to check in with your provider about disease prevention and staying healthy. In between checkups, there are plenty of things you can do on your own. Experts have done a lot of research about which lifestyle changes and preventive measures are most likely to keep you healthy. Ask your health care provider for more information. WEIGHT AND DIET  Eat a healthy diet  Be sure to include plenty of vegetables, fruits, low-fat dairy products, and lean protein.  Do not eat a lot of foods high in solid fats, added sugars, or salt.  Get regular exercise. This is one of the most important things you can do for your health.  Most adults should exercise for at least 150 minutes each week. The exercise should increase your heart rate and make you sweat (moderate-intensity exercise).  Most adults should also do strengthening exercises at least twice a week. This is in addition to the moderate-intensity exercise.  Maintain a healthy weight  Body mass index (BMI) is a measurement that can be used to identify possible weight problems. It estimates body fat based on height and weight. Your health care provider can help determine your BMI and help you achieve or maintain a healthy weight.  For females 25 years of age and older:   A BMI below 18.5 is considered underweight.  A BMI of 18.5 to 24.9 is normal.  A BMI of 25 to 29.9 is considered overweight.  A BMI of 30 and above is considered obese.  Watch levels of cholesterol and blood lipids  You should start having your blood tested for lipids and cholesterol at 66 years of age, then have this test every 5 years.  You may need to have your cholesterol levels checked more often if:  Your lipid or  cholesterol levels are high.  You are older than 66 years of age.  You are at high risk for heart disease.  CANCER SCREENING   Lung Cancer  Lung cancer screening is recommended for adults 97-92 years old who are at high risk for lung cancer because of a history of smoking.  A yearly low-dose CT scan of the lungs is recommended for people who:  Currently smoke.  Have quit within the past 15 years.  Have at least a 30-pack-year history of smoking. A pack year is smoking an average of one pack of cigarettes a day for 1 year.  Yearly screening should continue until it has been 15 years since you quit.  Yearly screening should stop if you develop a health problem that would prevent you from having lung cancer treatment.  Breast Cancer  Practice breast self-awareness. This means understanding how your breasts normally appear and feel.  It also means doing regular breast self-exams. Let your health care provider know about any changes, no matter how small.  If you are in your 20s or 30s, you should have a clinical breast exam (CBE) by a health care provider every 1-3 years as part of a regular health exam.  If you are 76 or older, have a CBE every year. Also consider having a breast X-ray (mammogram) every year.  If you have a family history of breast cancer, talk to your health care provider about genetic screening.  If you are  at high risk for breast cancer, talk to your health care provider about having an MRI and a mammogram every year.  Breast cancer gene (BRCA) assessment is recommended for women who have family members with BRCA-related cancers. BRCA-related cancers include:  Breast.  Ovarian.  Tubal.  Peritoneal cancers.  Results of the assessment will determine the need for genetic counseling and BRCA1 and BRCA2 testing. Cervical Cancer Routine pelvic examinations to screen for cervical cancer are no longer recommended for nonpregnant women who are considered low  risk for cancer of the pelvic organs (ovaries, uterus, and vagina) and who do not have symptoms. A pelvic examination may be necessary if you have symptoms including those associated with pelvic infections. Ask your health care provider if a screening pelvic exam is right for you.   The Pap test is the screening test for cervical cancer for women who are considered at risk.  If you had a hysterectomy for a problem that was not cancer or a condition that could lead to cancer, then you no longer need Pap tests.  If you are older than 65 years, and you have had normal Pap tests for the past 10 years, you no longer need to have Pap tests.  If you have had past treatment for cervical cancer or a condition that could lead to cancer, you need Pap tests and screening for cancer for at least 20 years after your treatment.  If you no longer get a Pap test, assess your risk factors if they change (such as having a new sexual partner). This can affect whether you should start being screened again.  Some women have medical problems that increase their chance of getting cervical cancer. If this is the case for you, your health care provider may recommend more frequent screening and Pap tests.  The human papillomavirus (HPV) test is another test that may be used for cervical cancer screening. The HPV test looks for the virus that can cause cell changes in the cervix. The cells collected during the Pap test can be tested for HPV.  The HPV test can be used to screen women 30 years of age and older. Getting tested for HPV can extend the interval between normal Pap tests from three to five years.  An HPV test also should be used to screen women of any age who have unclear Pap test results.  After 66 years of age, women should have HPV testing as often as Pap tests.  Colorectal Cancer  This type of cancer can be detected and often prevented.  Routine colorectal cancer screening usually begins at 66 years of  age and continues through 66 years of age.  Your health care provider may recommend screening at an earlier age if you have risk factors for colon cancer.  Your health care provider may also recommend using home test kits to check for hidden blood in the stool.  A small camera at the end of a tube can be used to examine your colon directly (sigmoidoscopy or colonoscopy). This is done to check for the earliest forms of colorectal cancer.  Routine screening usually begins at age 50.  Direct examination of the colon should be repeated every 5-10 years through 66 years of age. However, you may need to be screened more often if early forms of precancerous polyps or small growths are found. Skin Cancer  Check your skin from head to toe regularly.  Tell your health care provider about any new moles or changes in   moles, especially if there is a change in a mole's shape or color.  Also tell your health care provider if you have a mole that is larger than the size of a pencil eraser.  Always use sunscreen. Apply sunscreen liberally and repeatedly throughout the day.  Protect yourself by wearing long sleeves, pants, a wide-brimmed hat, and sunglasses whenever you are outside. HEART DISEASE, DIABETES, AND HIGH BLOOD PRESSURE   Have your blood pressure checked at least every 1-2 years. High blood pressure causes heart disease and increases the risk of stroke.  If you are between 75 years and 42 years old, ask your health care provider if you should take aspirin to prevent strokes.  Have regular diabetes screenings. This involves taking a blood sample to check your fasting blood sugar level.  If you are at a normal weight and have a low risk for diabetes, have this test once every three years after 66 years of age.  If you are overweight and have a high risk for diabetes, consider being tested at a younger age or more often. PREVENTING INFECTION  Hepatitis B  If you have a higher risk for  hepatitis B, you should be screened for this virus. You are considered at high risk for hepatitis B if:  You were born in a country where hepatitis B is common. Ask your health care provider which countries are considered high risk.  Your parents were born in a high-risk country, and you have not been immunized against hepatitis B (hepatitis B vaccine).  You have HIV or AIDS.  You use needles to inject street drugs.  You live with someone who has hepatitis B.  You have had sex with someone who has hepatitis B.  You get hemodialysis treatment.  You take certain medicines for conditions, including cancer, organ transplantation, and autoimmune conditions. Hepatitis C  Blood testing is recommended for:  Everyone born from [birth year] through 1967.  Anyone with known risk factors for hepatitis C. Sexually transmitted infections (STIs)  You should be screened for sexually transmitted infections (STIs) including gonorrhea and chlamydia if:  You are sexually active and are younger than 66 years of age.  You are older than 66 years of age and your health care provider tells you that you are at risk for this type of infection.  Your sexual activity has changed since you were last screened and you are at an increased risk for chlamydia or gonorrhea. Ask your health care provider if you are at risk.  If you do not have HIV, but are at risk, it may be recommended that you take a prescription medicine daily to prevent HIV infection. This is called pre-exposure prophylaxis (PrEP). You are considered at risk if:  You are sexually active and do not regularly use condoms or know the HIV status of your partner(s).  You take drugs by injection.  You are sexually active with a partner who has HIV. Talk with your health care provider about whether you are at high risk of being infected with HIV. If you choose to begin PrEP, you should first be tested for HIV. You should then be tested every 3 months for  as long as you are taking PrEP.  PREGNANCY   If you are premenopausal and you may become pregnant, ask your health care provider about preconception counseling.  If you may become pregnant, take 400 to 800 micrograms (mcg) of folic acid every day.  If you want to prevent pregnancy, talk to your  health care provider about birth control (contraception). OSTEOPOROSIS AND MENOPAUSE   Osteoporosis is a disease in which the bones lose minerals and strength with aging. This can result in serious bone fractures. Your risk for osteoporosis can be identified using a bone density scan.  If you are 42 years of age or older, or if you are at risk for osteoporosis and fractures, ask your health care provider if you should be screened.  Ask your health care provider whether you should take a calcium or vitamin D supplement to lower your risk for osteoporosis.  Menopause may have certain physical symptoms and risks.  Hormone replacement therapy may reduce some of these symptoms and risks. Talk to your health care provider about whether hormone replacement therapy is right for you.  HOME CARE INSTRUCTIONS   Schedule regular health, dental, and eye exams.  Stay current with your immunizations.   Do not use any tobacco products including cigarettes, chewing tobacco, or electronic cigarettes.  If you are pregnant, do not drink alcohol.  If you are breastfeeding, limit how much and how often you drink alcohol.  Limit alcohol intake to no more than 1 drink per day for nonpregnant women. One drink equals 12 ounces of beer, 5 ounces of wine, or 1 ounces of hard liquor.  Do not use street drugs.  Do not share needles.  Ask your health care provider for help if you need support or information about quitting drugs.  Tell your health care provider if you often feel depressed.  Tell your health care provider if you have ever been abused or do not feel safe at home. Document Released: 08/31/2010  Document Revised: 07/02/2013 Document Reviewed: 01/17/2013 [facility] Patient Information 2015 [place], [place]. This information is not intended to replace advice given to you by your health care provider. Make sure you discuss any questions you have with your health care provider. Sinusitis Sinusitis is redness, soreness, and inflammation of the paranasal sinuses. Paranasal sinuses are air pockets within the bones of your face (beneath the eyes, the middle of the forehead, or above the eyes). In healthy paranasal sinuses, mucus is able to drain out, and air is able to circulate through them by way of your nose. However, when your paranasal sinuses are inflamed, mucus and air can become trapped. This can allow bacteria and other germs to grow and cause infection. Sinusitis can develop quickly and last only a short time (acute) or continue over a long period (chronic). Sinusitis that lasts for more than 12 weeks is considered chronic.  CAUSES  Causes of sinusitis include:  Allergies.  Structural abnormalities, such as displacement of the cartilage that separates your nostrils (deviated septum), which can decrease the air flow through your nose and sinuses and affect sinus drainage.  Functional abnormalities, such as when the small hairs (cilia) that line your sinuses and help remove mucus do not work properly or are not present. SIGNS AND SYMPTOMS  Symptoms of acute and chronic sinusitis are the same. The primary symptoms are pain and pressure around the affected sinuses. Other symptoms include:  Upper toothache.  Earache.  Headache.  Bad breath.  Decreased sense of smell and taste.  A cough, which worsens when you are lying flat.  Fatigue.  Fever.  Thick drainage from your nose, which often is green and may contain pus (purulent).  Swelling and warmth over the affected sinuses. DIAGNOSIS  Your health care provider will perform a physical exam. During the exam, your health care  provider may:  Look in your nose for signs of abnormal growths in your nostrils (nasal polyps).  Tap over the affected sinus to check for signs of infection.  View the inside of your sinuses (endoscopy) using an imaging device that has a light attached (endoscope). If your health care provider suspects that you have chronic sinusitis, one or more of the following tests may be recommended:  Allergy tests.  Nasal culture. A sample of mucus is taken from your nose, sent to a lab, and screened for bacteria.  Nasal cytology. A sample of mucus is taken from your nose and examined by your health care provider to determine if your sinusitis is related to an allergy. TREATMENT  Most cases of acute sinusitis are related to a viral infection and will resolve on their own within 10 days. Sometimes medicines are prescribed to help relieve symptoms (pain medicine, decongestants, nasal steroid sprays, or saline sprays).  However, for sinusitis related to a bacterial infection, your health care provider will prescribe antibiotic medicines. These are medicines that will help kill the bacteria causing the infection.  Rarely, sinusitis is caused by a fungal infection. In theses cases, your health care provider will prescribe antifungal medicine. For some cases of chronic sinusitis, surgery is needed. Generally, these are cases in which sinusitis recurs more than 3 times per year, despite other treatments. HOME CARE INSTRUCTIONS   Drink plenty of water. Water helps thin the mucus so your sinuses can drain more easily.  Use a humidifier.  Inhale steam 3 to 4 times a day (for example, sit in the bathroom with the shower running).  Apply a warm, moist washcloth to your face 3 to 4 times a day, or as directed by your health care provider.  Use saline nasal sprays to help moisten and clean your sinuses.  Take medicines only as directed by your health care provider.  If you were prescribed either an  antibiotic or antifungal medicine, finish it all even if you start to feel better. SEEK IMMEDIATE MEDICAL CARE IF:  You have increasing pain or severe headaches.  You have nausea, vomiting, or drowsiness.  You have swelling around your face.  You have vision problems.  You have a stiff neck.  You have difficulty breathing. MAKE SURE YOU:   Understand these instructions.  Will watch your condition.  Will get help right away if you are not doing well or get worse. Document Released: 02/15/2005 Document Revised: 07/02/2013 Document Reviewed: 03/02/2011 [facility] Patient Information 2015 [place], [place]. This information is not intended to replace advice given to you by your health care provider. Make sure you discuss any questions you have with your health care provider.

## 2014-04-02 ENCOUNTER — Other Ambulatory Visit: Payer: Self-pay | Admitting: Family

## 2014-04-02 DIAGNOSIS — E559 Vitamin D deficiency, unspecified: Secondary | ICD-10-CM | POA: Insufficient documentation

## 2014-04-02 LAB — CMP14+EGFR
ALT: 13 IU/L (ref 0–32)
AST: 14 IU/L (ref 0–40)
Albumin/Globulin Ratio: 2 (ref 1.1–2.5)
Albumin: 4 g/dL (ref 3.6–4.8)
Alkaline Phosphatase: 104 IU/L (ref 39–117)
BUN / CREAT RATIO: 10 — AB (ref 11–26)
BUN: 8 mg/dL (ref 8–27)
CALCIUM: 9.2 mg/dL (ref 8.7–10.3)
CHLORIDE: 101 mmol/L (ref 97–108)
CO2: 23 mmol/L (ref 18–29)
Creatinine, Ser: 0.81 mg/dL (ref 0.57–1.00)
GFR calc Af Amer: 88 mL/min/{1.73_m2} (ref >59)
GFR, EST NON AFRICAN AMERICAN: 76 mL/min/{1.73_m2} (ref >59)
GLOBULIN, TOTAL: 2 g/dL (ref 1.5–4.5)
Glucose: 87 mg/dL (ref 65–99)
Potassium: 4.4 mmol/L (ref 3.5–5.2)
Sodium: 137 mmol/L (ref 134–144)
Total Bilirubin: 0.4 mg/dL (ref 0.0–1.2)
Total Protein: 6 g/dL (ref 6.0–8.5)

## 2014-04-02 LAB — LIPID PANEL
CHOL/HDL RATIO: 3.2 ratio (ref 0.0–4.4)
Cholesterol, Total: 152 mg/dL (ref 100–199)
HDL: 47 mg/dL (ref >39)
LDL Calculated: 76 mg/dL (ref 0–99)
Triglycerides: 146 mg/dL (ref 0–149)
VLDL CHOLESTEROL CAL: 29 mg/dL (ref 5–40)

## 2014-04-02 LAB — VITAMIN D 25 HYDROXY (VIT D DEFICIENCY, FRACTURES): Vit D, 25-Hydroxy: 25 ng/mL — ABNORMAL LOW (ref 30.0–100.0)

## 2014-04-02 MED ORDER — VITAMIN D (ERGOCALCIFEROL) 1.25 MG (50000 UNIT) PO CAPS: 50000.0000 [IU] | ORAL_CAPSULE | ORAL | Status: DC

## 2014-04-03 ENCOUNTER — Telehealth: Payer: Self-pay | Admitting: Family

## 2014-04-03 NOTE — Telephone Encounter (Signed)
Advised patient ok to take regular vitamin with Vitamin D rxd. Will recheck level at next visit

## 2014-04-29 ENCOUNTER — Other Ambulatory Visit: Payer: Self-pay | Admitting: Family Medicine

## 2014-04-30 ENCOUNTER — Other Ambulatory Visit: Payer: Self-pay | Admitting: *Deleted

## 2014-04-30 NOTE — Telephone Encounter (Signed)
Lorazepam script left on [facility].

## 2014-04-30 NOTE — Telephone Encounter (Signed)
Last seen 04/01/14 Neysa BonitoChristy  [facility]

## 2014-04-30 NOTE — Telephone Encounter (Signed)
Prescription sent to pharmacy.

## 2014-05-03 ENCOUNTER — Telehealth: Payer: Self-pay | Admitting: Family

## 2014-05-06 NOTE — Telephone Encounter (Signed)
Stp she states her insurance won't cover any cough medications but wanted to know if there was anything else we could call in. Advised pt we can't give her any different cough rx that will be covered. Pt voiced understanding.

## 2014-06-10 ENCOUNTER — Telehealth: Payer: Self-pay | Admitting: Family

## 2014-06-10 NOTE — Telephone Encounter (Signed)
Appointment given for 2:40 tomorrow with Neysa Bonitohristy

## 2014-06-11 ENCOUNTER — Ambulatory Visit (INDEPENDENT_AMBULATORY_CARE_PROVIDER_SITE_OTHER): Payer: Medicare Other | Admitting: Family

## 2014-06-11 ENCOUNTER — Encounter: Payer: Self-pay | Admitting: Family

## 2014-06-11 VITALS — BP 109/60 | HR 77 | Temp 98.0°F | Ht 64.0 in | Wt 139.4 lb

## 2014-06-11 DIAGNOSIS — J019 Acute sinusitis, unspecified: Secondary | ICD-10-CM

## 2014-06-11 MED ORDER — AMOXICILLIN-POT CLAVULANATE 875-125 MG PO TABS: 1.0000 | ORAL_TABLET | Freq: Two times a day (BID) | ORAL | Status: DC

## 2014-06-11 MED ORDER — METHYLPREDNISOLONE (PAK) 4 MG PO TABS: ORAL_TABLET | ORAL | Status: DC

## 2014-06-11 NOTE — Patient Instructions (Signed)
Sinusitis Sinusitis is redness, soreness, and inflammation of the paranasal sinuses. Paranasal sinuses are air pockets within the bones of your face (beneath the eyes, the middle of the forehead, or above the eyes). In healthy paranasal sinuses, mucus is able to drain out, and air is able to circulate through them by way of your nose. However, when your paranasal sinuses are inflamed, mucus and air can become trapped. This can allow bacteria and other germs to grow and cause infection. Sinusitis can develop quickly and last only a short time (acute) or continue over a long period (chronic). Sinusitis that lasts for more than 12 weeks is considered chronic.  CAUSES  Causes of sinusitis include:  Allergies.  Structural abnormalities, such as displacement of the cartilage that separates your nostrils (deviated septum), which can decrease the air flow through your nose and sinuses and affect sinus drainage.  Functional abnormalities, such as when the small hairs (cilia) that line your sinuses and help remove mucus do not work properly or are not present. SIGNS AND SYMPTOMS  Symptoms of acute and chronic sinusitis are the same. The primary symptoms are pain and pressure around the affected sinuses. Other symptoms include:  Upper toothache.  Earache.  Headache.  Bad breath.  Decreased sense of smell and taste.  A cough, which worsens when you are lying flat.  Fatigue.  Fever.  Thick drainage from your nose, which often is green and may contain pus (purulent).  Swelling and warmth over the affected sinuses. DIAGNOSIS  Your health care provider will perform a physical exam. During the exam, your health care provider may:  Look in your nose for signs of abnormal growths in your nostrils (nasal polyps).  Tap over the affected sinus to check for signs of infection.  View the inside of your sinuses (endoscopy) using an imaging device that has a light attached (endoscope). If your health  care provider suspects that you have chronic sinusitis, one or more of the following tests may be recommended:  Allergy tests.  Nasal culture. A sample of mucus is taken from your nose, sent to a lab, and screened for bacteria.  Nasal cytology. A sample of mucus is taken from your nose and examined by your health care provider to determine if your sinusitis is related to an allergy. TREATMENT  Most cases of acute sinusitis are related to a viral infection and will resolve on their own within 10 days. Sometimes medicines are prescribed to help relieve symptoms (pain medicine, decongestants, nasal steroid sprays, or saline sprays).  However, for sinusitis related to a bacterial infection, your health care provider will prescribe antibiotic medicines. These are medicines that will help kill the bacteria causing the infection.  Rarely, sinusitis is caused by a fungal infection. In theses cases, your health care provider will prescribe antifungal medicine. For some cases of chronic sinusitis, surgery is needed. Generally, these are cases in which sinusitis recurs more than 3 times per year, despite other treatments. HOME CARE INSTRUCTIONS   Drink plenty of water. Water helps thin the mucus so your sinuses can drain more easily.  Use a humidifier.  Inhale steam 3 to 4 times a day (for example, sit in the bathroom with the shower running).  Apply a warm, moist washcloth to your face 3 to 4 times a day, or as directed by your health care provider.  Use saline nasal sprays to help moisten and clean your sinuses.  Take medicines only as directed by your health care provider.    If you were prescribed either an antibiotic or antifungal medicine, finish it all even if you start to feel better. SEEK IMMEDIATE MEDICAL CARE IF:  You have increasing pain or severe headaches.  You have nausea, vomiting, or drowsiness.  You have swelling around your face.  You have vision problems.  You have a stiff  neck.  You have difficulty breathing. MAKE SURE YOU:   Understand these instructions.  Will watch your condition.  Will get help right away if you are not doing well or get worse. Document Released: 02/15/2005 Document Revised: 07/02/2013 Document Reviewed: 03/02/2011 [employer] Patient Information 2015 [employer]. This information is not intended to replace advice given to you by your health care provider. Make sure you discuss any questions you have with your health care provider.  - Take meds as prescribed - Use a cool mist humidifier  -Use saline nose sprays frequently -Saline irrigations of the nose can be very helpful if done frequently.  * 4X daily for 1 week*  * Use of a nettie pot can be helpful with this. Follow directions with this* -Force fluids -For any cough or congestion  Use plain Mucinex- regular strength or max strength is fine   * Children- consult with Pharmacist for dosing -For fever or aces or pains- take tylenol or ibuprofen appropriate for age and weight.  * for fevers greater than 101 orally you may alternate ibuprofen and tylenol every  3 hours. -Throat lozenges if help   Nikya Busler, FNP  

## 2014-06-11 NOTE — Progress Notes (Signed)
   Subjective:    Patient ID: Lindsey May, female    DOB: [birth year], 64 y.o.   MRN: [id number]  Headache  Associated symptoms include coughing, ear pain, sinus pressure and a sore throat. Pertinent negatives include no neck pain.  Sinusitis This is a new problem. The current episode started 1 to 4 weeks ago ("Last Wednesday"). The problem has been waxing and waning since onset. There has been no fever. Her pain is at a severity of 8/10. The pain is moderate. Associated symptoms include congestion, coughing, ear pain, headaches, a hoarse voice, sinus pressure, sneezing and a sore throat. Pertinent negatives include no chills, neck pain or shortness of breath. Past treatments include acetaminophen. The treatment provided mild relief.      Review of Systems  Constitutional: Negative.  Negative for chills.  HENT: Positive for congestion, ear pain, hoarse voice, sinus pressure, sneezing and sore throat.   Eyes: Negative.   Respiratory: Positive for cough. Negative for shortness of breath.   Cardiovascular: Negative.  Negative for palpitations.  Gastrointestinal: Negative.   Endocrine: Negative.   Genitourinary: Negative.   Musculoskeletal: Negative.  Negative for neck pain.  Neurological: Positive for headaches.  Hematological: Negative.   Psychiatric/Behavioral: Negative.   All other systems reviewed and are negative.      Objective:   Physical Exam  Constitutional: She is oriented to person, place, and time. She appears well-developed and well-nourished. No distress.  HENT:  Head: Normocephalic and atraumatic.  Right Ear: External ear normal.  Mouth/Throat: Oropharynx is clear and moist.  Eyes: Pupils are equal, round, and reactive to light.  Neck: Normal range of motion. Neck supple. No thyromegaly present.  Cardiovascular: Normal rate, regular rhythm, normal heart sounds and intact distal pulses.   No murmur heard. Pulmonary/Chest: Effort normal and breath sounds normal. No  respiratory distress. She has no wheezes.  Abdominal: Soft. Bowel sounds are normal. She exhibits no distension. There is no tenderness.  Musculoskeletal: Normal range of motion. She exhibits no edema or tenderness.  Neurological: She is alert and oriented to person, place, and time. She has normal reflexes. No cranial nerve deficit.  Skin: Skin is warm and dry.  Psychiatric: She has a normal mood and affect. Her behavior is normal. Judgment and thought content normal.  Vitals reviewed.    BP 109/60 mmHg  Pulse 77  Temp(Src) 98 F (36.7 C) (Oral)  Ht 5\' 4"  (1.626 m)  Wt 139 lb 6.4 oz (63.231 kg)  BMI 23.92 kg/m2      Assessment & Plan:  1. Acute sinusitis, recurrence not specified, unspecified location - Take meds as prescribed - Use a cool mist humidifier  -Use saline nose sprays frequently -Saline irrigations of the nose can be very helpful if done frequently.  * 4X daily for 1 week*  * Use of a nettie pot can be helpful with this. Follow directions with this* -Force fluids -For any cough or congestion  Use plain Mucinex- regular strength or max strength is fine   * Children- consult with Pharmacist for dosing -For fever or aces or pains- take tylenol or ibuprofen appropriate for age and weight.  * for fevers greater than 101 orally you may alternate ibuprofen and tylenol every  3 hours. -Throat lozenges if help -Medrol Dospack 4 mg  - amoxicillin-clavulanate (AUGMENTIN) 875-125 MG per tablet; Take 1 tablet by mouth 2 (two) times daily.  Dispense: 14 tablet; Refill: 0  Jannifer Rodneyhristy Veronica Fretz, FNP

## 2014-06-18 ENCOUNTER — Telehealth: Payer: Self-pay | Admitting: Family

## 2014-06-18 ENCOUNTER — Ambulatory Visit: Payer: Medicare Other | Admitting: *Deleted

## 2014-06-18 MED ORDER — LORAZEPAM 0.5 MG PO TABS: 0.5000 mg | ORAL_TABLET | Freq: Three times a day (TID) | ORAL | Status: DC | PRN

## 2014-06-18 NOTE — Telephone Encounter (Signed)
What?

## 2014-06-19 NOTE — Telephone Encounter (Signed)
lmovm for pt that refill has been called in to pharmacy

## 2014-06-19 NOTE — Telephone Encounter (Signed)
[facility]

## 2014-06-25 ENCOUNTER — Ambulatory Visit (INDEPENDENT_AMBULATORY_CARE_PROVIDER_SITE_OTHER): Payer: Medicare Other

## 2014-06-25 ENCOUNTER — Ambulatory Visit (INDEPENDENT_AMBULATORY_CARE_PROVIDER_SITE_OTHER): Payer: Medicare Other | Admitting: *Deleted

## 2014-06-25 ENCOUNTER — Encounter: Payer: Self-pay | Admitting: *Deleted

## 2014-06-25 VITALS — BP 131/75 | HR 77 | Resp 20 | Ht 64.5 in | Wt 139.4 lb

## 2014-06-25 DIAGNOSIS — Z0000 Encounter for general adult medical examination without abnormal findings: Secondary | ICD-10-CM

## 2014-06-25 DIAGNOSIS — Z23 Encounter for immunization: Secondary | ICD-10-CM

## 2014-06-25 DIAGNOSIS — M810 Age-related osteoporosis without current pathological fracture: Secondary | ICD-10-CM

## 2014-06-25 NOTE — Progress Notes (Signed)
Subjective:   Darnell Levellma Ridley is a 64 y.o. female who presents for an Initial Medicare Annual Wellness Visit. Ms. Randa Evensdwards reports that she has been under some stress for the last week because her oldest son has been having problems with his heart. She states that he will have to go back in a few days to have stents put in his heart. She lives alone and has been divorced since [id number]. She moved to [place] about a year ago after being exposed to lead in her home that she had lived in for 4 years. She states that he had to leave all of her belongings and start over. She has two sons and she does not drive. She depends on her sons and a close friend for transportation.  Review of Systems      Cardiac Risk Factors include: advanced age (>1255men, 48>65 women);family history of premature cardiovascular disease;smoking/ tobacco exposure     Objective:    Today's Vitals   06/25/14 0906  BP: 131/75  Pulse: 77  Resp: 20  Height: 5' 4.5" (1.638 m)  Weight: 139 lb 6.4 oz (63.231 kg)    Current Medications (verified) Outpatient Encounter Prescriptions as of 06/25/2014  Medication Sig  . albuterol (PROVENTIL HFA;VENTOLIN HFA) 108 (90 BASE) MCG/ACT inhaler Inhale 2 puffs into the lungs every 6 (six) hours as needed for wheezing or shortness of breath.  Marland Kitchen. albuterol (PROVENTIL) (2.5 MG/3ML) 0.083% nebulizer solution Take 3 mLs (2.5 mg total) by nebulization every 6 (six) hours as needed for wheezing or shortness of breath.  Marland Kitchen. aspirin 325 MG tablet Take 325 mg by mouth daily.  . Fluticasone-Salmeterol (ADVAIR DISKUS) 500-50 MCG/DOSE AEPB Inhale 1 puff into the lungs 2 (two) times daily.  Marland Kitchen. LORazepam (ATIVAN) 0.5 MG tablet Take 1 tablet (0.5 mg total) by mouth every 8 (eight) hours as needed. for anxiety  . Multiple Vitamins-Iron (MULTI-VITAMIN/IRON PO) Take 1 tablet by mouth daily.  Marland Kitchen. nystatin cream (MYCOSTATIN) Apply 1 application topically at bedtime.  Marland Kitchen. trimethoprim-polymyxin b (POLYTRIM) ophthalmic  solution Place 1 drop into both eyes every 4 (four) hours. Prn as needed  . Vitamin D, Ergocalciferol, (DRISDOL) 50000 UNITS CAPS capsule Take 1 capsule (50,000 Units total) by mouth every 7 (seven) days.  . [DISCONTINUED] amoxicillin-clavulanate (AUGMENTIN) 875-125 MG per tablet Take 1 tablet by mouth 2 (two) times daily. (Patient not taking: Reported on 06/25/2014)  . [DISCONTINUED] meclizine (ANTIVERT) 25 MG tablet TAKE ONE TABLET BY MOUTH THREE TIMES DAILY AS NEEDED FOR  DIZZINESS  . [DISCONTINUED] methylPREDNIsolone (MEDROL DOSPACK) 4 MG tablet follow package directions (Patient not taking: Reported on 06/25/2014)  . [DISCONTINUED] sertraline (ZOLOFT) 50 MG tablet Take 1 tablet (50 mg total) by mouth daily. (Patient not taking: Reported on 06/25/2014)    Allergies (verified) Zithromax and Codeine   History: Past Medical History  Diagnosis Date  . Anxiety   . Vertigo   . Depression   . COPD (chronic obstructive pulmonary disease)   . Osteoporosis   . Arthritis    Past Surgical History  Procedure Laterality Date  . Tubal ligation  1974   Family History  Problem Relation Age of Onset  . Heart disease Mother   . Diabetes Mother   . Heart disease Father   . Heart disease Son   . Stroke Son    Social History   Occupational History  . Disabled     Daycare   Social History Main Topics  . Smoking status: Former Smoker --  0.25 packs/day    Types: Cigarettes    Start date: 06/25/1966    Quit date: 12/31/2013  . Smokeless tobacco: Never Used  . Alcohol Use: No  . Drug Use: No  . Sexual Activity: No    Tobacco Counseling Patient does not smoke  Activities of Daily Living In your present state of health, do you have any difficulty performing the following activities: 06/25/2014  Hearing? N  Vision? Y  Difficulty concentrating or making decisions? N  Walking or climbing stairs? N  Dressing or bathing? N  Doing errands, shopping? N  Preparing Food and eating ? N  Using  the Toilet? N  In the past six months, have you accidently leaked urine? N  Do you have problems with loss of bowel control? N  Managing your Medications? N  Managing your Finances? N  Housekeeping or managing your Housekeeping? N    Immunizations and Health Maintenance Immunization History  Administered Date(s) Administered  . Tdap 06/25/2014  . Zoster 06/25/2014   Health Maintenance Due  Topic Date Due  . COLONOSCOPY  06/25/1998  . PNA vac Low Risk Adult (1 of 2 - PCV13) 06/24/2013    Patient Care Team: Junie Spencer, FNP as PCP - General (Nurse Practitioner)       Assessment:   This is a routine wellness examination for Sumedha.   Hearing/Vision screen No hearing deficits noted. Patient reports that she wears glasses to read only. She will call for an eye exam. She does not have time and will call once her son gets better.  Dietary issues and exercise activities discussed: Current Exercise Habits:: Home exercise routine, Type of exercise: walking, Time (Minutes): 20, Frequency (Times/Week): 2, Weekly Exercise (Minutes/Week): 40, Intensity: Mild  Goals    None     Depression Screen PHQ 2/9 Scores 06/25/2014 04/01/2014 12/13/2013 10/22/2013 07/13/2013  PHQ - 2 Score 1 2 0 0 0  PHQ- 9 Score - 3 - - -    Fall Risk Fall Risk  06/25/2014 04/01/2014 12/13/2013 10/22/2013 07/13/2013  Falls in the past year? No No No No No    Cognitive Function: MMSE - Mini Mental State Exam 06/25/2014  Orientation to time 5  Orientation to Place 5  Registration 3  Attention/ Calculation 5  Recall 3  Language- name 2 objects 2  Language- repeat 1  Language- follow 3 step command 3  Language- read & follow direction 1  Write a sentence 1  Copy design 1  Total score 30    Screening Tests Health Maintenance  Topic Date Due  . COLONOSCOPY  06/25/1998  . PNA vac Low Risk Adult (1 of 2 - PCV13) 06/24/2013  . INFLUENZA VACCINE  09/30/2014  . MAMMOGRAM  10/24/2014  . DEXA SCAN   06/24/2016  . TETANUS/TDAP  06/24/2024  . ZOSTAVAX  Completed      Plan:  Continue medications as previously ordered. Keep follow up appointment with Jannifer Rodney 10/01/2014 Fall prevention discussed with patient. Encouraged to increase activity by walking 3 times a week for 15-30 minutes at a time as tolerated.  During the course of the visit, Tyshana was educated and counseled about the following appropriate screening and preventive services:   Vaccines to include Pneumoccal, Influenza,  Tdap, Zostavax : Zostavax and Tdap given today. Patient will wait until her visit with Neysa Bonito to take the Prevnar 13.  Electrocardiogram recommend baseline at next office visit  Cardiovascular disease screening last labs normal on 04/01/14   Colorectal  cancer screening pt reports that she had a colonoscopy in 2014. FOBT given to patient and she will return them in a couple of weeks.  Bone density screening completed today and has follow up appointment on 07/01/14 with Tammy Eckerd Clinical Pharmacist to discuss results.  Diabetes screening last glucose 04/01/2014 normal   Glaucoma screening patient will call for eye exam wants to wait until her son is doing better   Mammography/PAP refuses at this time  Nutrition counseling DASH diet recommended   Smoking cessation counseling NA patient does not smoke   Patient Instructions (the written plan) were given to the patient.    Reece Packer, RN   06/25/2014      I have reviewed and agree with the above AWV documentation.  Mechele Claude, M.D.

## 2014-06-25 NOTE — Patient Instructions (Addendum)
Bone Densitometry Bone densitometry is a special X-ray that measures your bone density and can be used to help predict your risk of bone fractures. This test is used to determine bone mineral content and density to diagnose osteoporosis. Osteoporosis is the loss of bone that may cause the bone to become weak. Osteoporosis commonly occurs in women entering menopause. However, it may be found in men and in people with other diseases. PREPARATION FOR TEST No preparation necessary. WHO SHOULD BE TESTED?  All women older than 70.  Postmenopausal women (50 to 67) with risk factors for osteoporosis.  People with a previous fracture caused by normal activities.  People with a small body frame (less than 127 poundsor a body mass index [BMI] of less than 21).  People who have a parent with a hip fracture or history of osteoporosis.  People who smoke.  People who have rheumatoid arthritis.  Anyone who engages in excessive alcohol use (more than 3 drinks most days).  Women who experience early menopause. WHEN SHOULD YOU BE RETESTED? Current guidelines suggest that you should wait at least 2 years before doing a bone density test again if your first test was normal.Recent studies indicated that women with normal bone density may be able to wait a few years before needing to repeat a bone density test. You should discuss this with your caregiver.  NORMAL FINDINGS   Normal: less than standard deviation below normal (greater than -1).  Osteopenia: 1 to 2.5 standard deviations below normal (-1 to -2.5).  Osteoporosis: greater than 2.5 standard deviations below normal (less than -2.5). Test results are reported as a "T score" and a "Z score."The T score is a number that compares your bone density with the bone density of healthy, young women.The Z score is a number that compares your bone density with the scores of women who are the same age, gender, and race.  Ranges for normal findings may vary  among different laboratories and hospitals. You should always check with your doctor after having lab work or other tests done to discuss the meaning of your test results and whether your values are considered within normal limits. MEANING OF TEST  Your caregiver will go over the test results with you and discuss the importance and meaning of your results, as well as treatment options and the need for additional tests if necessary. OBTAINING THE TEST RESULTS It is your responsibility to obtain your test results. Ask the lab or department performing the test when and how you will get your results. Document Released: 03/09/2004 Document Revised: 05/10/2011 Document Reviewed: 04/01/2010 [facility] Patient Information 2015 [place], [place]. This information is not intended to replace advice given to you by your health care provider. Make sure you discuss any questions you have with your health care provider. Tdap Vaccine (Tetanus, Diphtheria, Pertussis): What You Need to Know 1. Why get vaccinated? Tetanus, diphtheria and pertussis can be very serious diseases, even for adolescents and adults. Tdap vaccine can protect [place] from these diseases. TETANUS (Lockjaw) causes painful muscle tightening and stiffness, usually all over the body.  It can lead to tightening of muscles in the head and neck so you can't open your mouth, swallow, or sometimes even breathe. Tetanus kills about 1 out of 5 people who are infected. DIPHTHERIA can cause a thick coating to form in the back of the throat.  It can lead to breathing problems, paralysis, heart failure, and death. PERTUSSIS (Whooping Cough) causes severe coughing spells, which can cause  difficulty breathing, vomiting and disturbed sleep.  It can also lead to weight loss, incontinence, and rib fractures. Up to 2 in 100 adolescents and 5 in 100 adults with pertussis are hospitalized or have complications, which could include pneumonia or death. These diseases are  caused by bacteria. Diphtheria and pertussis are spread from person to person through coughing or sneezing. Tetanus enters the body through cuts, scratches, or wounds. Before vaccines, the [place] saw as many as 200,000 cases a year of diphtheria and pertussis, and hundreds of cases of tetanus. Since vaccination began, tetanus and diphtheria have dropped by about 99% and pertussis by about 80%. 2. Tdap vaccine Tdap vaccine can protect adolescents and adults from tetanus, diphtheria, and pertussis. One dose of Tdap is routinely given at age 5 or 65. People who did not get Tdap at that age should get it as soon as possible. Tdap is especially important for health care professionals and anyone having close contact with a baby younger than 12 months. Pregnant women should get a dose of Tdap during every pregnancy, to protect the newborn from pertussis. Infants are most at risk for severe, life-threatening complications from pertussis. A similar vaccine, called Td, protects from tetanus and diphtheria, but not pertussis. A Td booster should be given every 10 years. Tdap may be given as one of these boosters if you have not already gotten a dose. Tdap may also be given after a severe cut or burn to prevent tetanus infection. Your doctor can give you more information. Tdap may safely be given at the same time as other vaccines. 3. Some people should not get this vaccine  If you ever had a life-threatening allergic reaction after a dose of any tetanus, diphtheria, or pertussis containing vaccine, OR if you have a severe allergy to any part of this vaccine, you should not get Tdap. Tell your doctor if you have any severe allergies.  If you had a coma, or long or multiple seizures within 7 days after a childhood dose of DTP or DTaP, you should not get Tdap, unless a cause other than the vaccine was found. You can still get Td.  Talk to your doctor if you:  have epilepsy or another nervous system  problem,  had severe pain or swelling after any vaccine containing diphtheria, tetanus or pertussis,  ever had Guillain-Barr Syndrome (GBS),  aren't feeling well on the day the shot is scheduled. 4. Risks of a vaccine reaction With any medicine, including vaccines, there is a chance of side effects. These are usually mild and go away on their own, but serious reactions are also possible. Brief fainting spells can follow a vaccination, leading to injuries from falling. Sitting or lying down for about 15 minutes can help prevent these. Tell your doctor if you feel dizzy or light-headed, or have vision changes or ringing in the ears. Mild problems following Tdap (Did not interfere with activities)  Pain where the shot was given (about 3 in 4 adolescents or 2 in 3 adults)  Redness or swelling where the shot was given (about 1 person in 5)  Mild fever of at least 100.53F (up to about 1 in 25 adolescents or 1 in 100 adults)  Headache (about 3 or 4 people in 10)  Tiredness (about 1 person in 3 or 4)  Nausea, vomiting, diarrhea, stomach ache (up to 1 in 4 adolescents or 1 in 10 adults)  Chills, body aches, sore joints, rash, swollen glands (uncommon) Moderate problems following  Tdap (Interfered with activities, but did not require medical attention)  Pain where the shot was given (about 1 in 5 adolescents or 1 in 100 adults)  Redness or swelling where the shot was given (up to about 1 in 16 adolescents or 1 in 25 adults)  Fever over 102F (about 1 in 100 adolescents or 1 in 250 adults)  Headache (about 3 in 20 adolescents or 1 in 10 adults)  Nausea, vomiting, diarrhea, stomach ache (up to 1 or 3 people in 100)  Swelling of the entire arm where the shot was given (up to about 3 in 100). Severe problems following Tdap (Unable to perform usual activities; required medical attention)  Swelling, severe pain, bleeding and redness in the arm where the shot was given (rare). A severe  allergic reaction could occur after any vaccine (estimated less than 1 in a million doses). 5. What if there is a serious reaction? What should I look for?  Look for anything that concerns you, such as signs of a severe allergic reaction, very high fever, or behavior changes. Signs of a severe allergic reaction can include hives, swelling of the face and throat, difficulty breathing, a fast heartbeat, dizziness, and weakness. These would start a few minutes to a few hours after the vaccination. What should I do?  If you think it is a severe allergic reaction or other emergency that can't wait, call 9-1-1 or get the person to the nearest hospital. Otherwise, call your doctor.  Afterward, the reaction should be reported to the "Vaccine Adverse Event Reporting System" (VAERS). Your doctor might file this report, or you can do it yourself through the VAERS web site at www.vaers.SamedayNews.es, or by calling 306-138-5492. VAERS is only for reporting reactions. They do not give medical advice.  6. The National Vaccine Injury Compensation Program The [employer] Vaccine Injury Compensation Program (VICP) is a federal program that was created to compensate people who may have been injured by certain vaccines. Persons who believe they may have been injured by a vaccine can learn about the program and about filing a claim by calling (763) 297-4273 or visiting the [place] website at GoldCloset.com.ee. 7. How can I learn more?  Ask your doctor.  Call your local or state health department.  Contact the Centers for Disease Control and Prevention (CDC):  Call 539-687-2447 or visit CDC's website at http://hunter.com/. CDC Tdap Vaccine VIS (07/08/11) Document Released: 08/17/2011 Document Revised: 07/02/2013 Document Reviewed: 05/30/2013 [employer] Patient Information 2015 [place], [place]. This information is not intended to replace advice given to you by your health care provider. Make sure you  discuss any questions you have with your health care provider. Shingles Shingles (herpes zoster) is an infection that is caused by the same virus that causes chickenpox (varicella). The infection causes a painful skin rash and fluid-filled blisters, which eventually break open, crust over, and heal. It may occur in any area of the body, but it usually affects only one side of the body or face. The pain of shingles usually lasts about 1 month. However, some people with shingles may develop long-term (chronic) pain in the affected area of the body. Shingles often occurs many years after the person had chickenpox. It is more common:  In people older than 50 years.  In people with weakened immune systems, such as those with HIV, AIDS, or cancer.  In people taking medicines that weaken the immune system, such as transplant medicines.  In people under great stress. CAUSES  Shingles is caused  by the varicella zoster virus (VZV), which also causes chickenpox. After a person is infected with the virus, it can remain in the person's body for years in an inactive state (dormant). To cause shingles, the virus reactivates and breaks out as an infection in a nerve root. The virus can be spread from person to person (contagious) through contact with open blisters of the shingles rash. It will only spread to people who have not had chickenpox. When these people are exposed to the virus, they may develop chickenpox. They will not develop shingles. Once the blisters scab over, the person is no longer contagious and cannot spread the virus to others. SIGNS AND SYMPTOMS  Shingles shows up in stages. The initial symptoms may be pain, itching, and tingling in an area of the skin. This pain is usually described as burning, stabbing, or throbbing.In a few days or weeks, a painful red rash will appear in the area where the pain, itching, and tingling were felt. The rash is usually on one side of the body in a band or  belt-like pattern. Then, the rash usually turns into fluid-filled blisters. They will scab over and dry up in approximately 2-3 weeks. Flu-like symptoms may also occur with the initial symptoms, the rash, or the blisters. These may include:  Fever.  Chills.  Headache.  Upset stomach. DIAGNOSIS  Your health care provider will perform a skin exam to diagnose shingles. Skin scrapings or fluid samples may also be taken from the blisters. This sample will be examined under a microscope or sent to a lab for further testing. TREATMENT  There is no specific cure for shingles. Your health care provider will likely prescribe medicines to help you manage the pain, recover faster, and avoid long-term problems. This may include antiviral drugs, anti-inflammatory drugs, and pain medicines. HOME CARE INSTRUCTIONS   Take a cool bath or apply cool compresses to the area of the rash or blisters as directed. This may help with the pain and itching.   Take medicines only as directed by your health care provider.   Rest as directed by your health care provider.  Keep your rash and blisters clean with mild soap and cool water or as directed by your health care provider.  Do not pick your blisters or scratch your rash. Apply an anti-itch cream or numbing creams to the affected area as directed by your health care provider.  Keep your shingles rash covered with a loose bandage (dressing).  Avoid skin contact with:  Babies.   Pregnant women.   Children with eczema.   Elderly people with transplants.   People with chronic illnesses, such as leukemia or AIDS.   Wear loose-fitting clothing to help ease the pain of material rubbing against the rash.  Keep all follow-up visits as directed by your health care provider.If the area involved is on your face, you may receive a referral for a specialist, such as an eye doctor (ophthalmologist) or an ear, nose, and throat (ENT) doctor. Keeping all  follow-up visits will help you avoid eye problems, chronic pain, or disability.  SEEK IMMEDIATE MEDICAL CARE IF:   You have facial pain, pain around the eye area, or loss of feeling on one side of your face.  You have ear pain or ringing in your ear.  You have loss of taste.  Your pain is not relieved with prescribed medicines.   Your redness or swelling spreads.   You have more pain and swelling.  Your condition  is worsening or has changed.   You have a fever. MAKE SURE YOU:  Understand these instructions.  Will watch your condition.  Will get help right away if you are not doing well or get worse. Document Released: 02/15/2005 Document Revised: 07/02/2013 Document Reviewed: 09/30/2011 [facility] Patient Information 2015 [place], [place]. This information is not intended to replace advice given to you by your health care provider. Make sure you discuss any questions you have with your health care provider. Preventive Care for Adults A healthy lifestyle and preventive care can promote health and wellness. Preventive health guidelines for women include the following key practices.  A routine yearly physical is a good way to check with your health care provider about your health and preventive screening. It is a chance to share any concerns and updates on your health and to receive a thorough exam.  Visit your dentist for a routine exam and preventive care every 6 months. Brush your teeth twice a day and floss once a day. Good oral hygiene prevents tooth decay and gum disease.  The frequency of eye exams is based on your age, health, family medical history, use of contact lenses, and other factors. Follow your health care provider's recommendations for frequency of eye exams.  Eat a healthy diet. Foods like vegetables, fruits, whole grains, low-fat dairy products, and lean protein foods contain the nutrients you need without too many calories. Decrease your intake of foods high in  solid fats, added sugars, and salt. Eat the right amount of calories for you.Get information about a proper diet from your health care provider, if necessary.  Regular physical exercise is one of the most important things you can do for your health. Most adults should get at least 150 minutes of moderate-intensity exercise (any activity that increases your heart rate and causes you to sweat) each week. In addition, most adults need muscle-strengthening exercises on 2 or more days a week.  Maintain a healthy weight. The body mass index (BMI) is a screening tool to identify possible weight problems. It provides an estimate of body fat based on height and weight. Your health care provider can find your BMI and can help you achieve or maintain a healthy weight.For adults 20 years and older:  A BMI below 18.5 is considered underweight.  A BMI of 18.5 to 24.9 is normal.  A BMI of 25 to 29.9 is considered overweight.  A BMI of 30 and above is considered obese.  Maintain normal blood lipids and cholesterol levels by exercising and minimizing your intake of saturated fat. Eat a balanced diet with plenty of fruit and vegetables. Blood tests for lipids and cholesterol should begin at age 92 and be repeated every 5 years. If your lipid or cholesterol levels are high, you are over 50, or you are at high risk for heart disease, you may need your cholesterol levels checked more frequently.Ongoing high lipid and cholesterol levels should be treated with medicines if diet and exercise are not working.  If you smoke, find out from your health care provider how to quit. If you do not use tobacco, do not start.  Lung cancer screening is recommended for adults aged 62-80 years who are at high risk for developing lung cancer because of a history of smoking. A yearly low-dose CT scan of the lungs is recommended for people who have at least a 30-pack-year history of smoking and are a current smoker or have quit within  the past 15 years. A pack  year of smoking is smoking an average of 1 pack of cigarettes a day for 1 year (for example: 1 pack a day for 30 years or 2 packs a day for 15 years). Yearly screening should continue until the smoker has stopped smoking for at least 15 years. Yearly screening should be stopped for people who develop a health problem that would prevent them from having lung cancer treatment.  If you are pregnant, do not drink alcohol. If you are breastfeeding, be very cautious about drinking alcohol. If you are not pregnant and choose to drink alcohol, do not have more than 1 drink per day. One drink is considered to be 12 ounces (355 mL) of beer, 5 ounces (148 mL) of wine, or 1.5 ounces (44 mL) of liquor.  Avoid use of street drugs. Do not share needles with anyone. Ask for help if you need support or instructions about stopping the use of drugs.  High blood pressure causes heart disease and increases the risk of stroke. Your blood pressure should be checked at least every 1 to 2 years. Ongoing high blood pressure should be treated with medicines if weight loss and exercise do not work.  If you are 53-23 years old, ask your health care provider if you should take aspirin to prevent strokes.  Diabetes screening involves taking a blood sample to check your fasting blood sugar level. This should be done once every 3 years, after age 63, if you are within normal weight and without risk factors for diabetes. Testing should be considered at a younger age or be carried out more frequently if you are overweight and have at least 1 risk factor for diabetes.  Breast cancer screening is essential preventive care for women. You should practice "breast self-awareness." This means understanding the normal appearance and feel of your breasts and may include breast self-examination. Any changes detected, no matter how small, should be reported to a health care provider. Women in their 38s and 30s should have a  clinical breast exam (CBE) by a health care provider as part of a regular health exam every 1 to 3 years. After age 18, women should have a CBE every year. Starting at age 27, women should consider having a mammogram (breast X-ray test) every year. Women who have a family history of breast cancer should talk to their health care provider about genetic screening. Women at a high risk of breast cancer should talk to their health care providers about having an MRI and a mammogram every year.  Breast cancer gene (BRCA)-related cancer risk assessment is recommended for women who have family members with BRCA-related cancers. BRCA-related cancers include breast, ovarian, tubal, and peritoneal cancers. Having family members with these cancers may be associated with an increased risk for harmful changes (mutations) in the breast cancer genes BRCA1 and BRCA2. Results of the assessment will determine the need for genetic counseling and BRCA1 and BRCA2 testing.  Routine pelvic exams to screen for cancer are no longer recommended for nonpregnant women who are considered low risk for cancer of the pelvic organs (ovaries, uterus, and vagina) and who do not have symptoms. Ask your health care provider if a screening pelvic exam is right for you.  If you have had past treatment for cervical cancer or a condition that could lead to cancer, you need Pap tests and screening for cancer for at least 20 years after your treatment. If Pap tests have been discontinued, your risk factors (such as having a new  sexual partner) need to be reassessed to determine if screening should be resumed. Some women have medical problems that increase the chance of getting cervical cancer. In these cases, your health care provider may recommend more frequent screening and Pap tests.  The HPV test is an additional test that may be used for cervical cancer screening. The HPV test looks for the virus that can cause the cell changes on the cervix. The  cells collected during the Pap test can be tested for HPV. The HPV test could be used to screen women aged 66 years and older, and should be used in women of any age who have unclear Pap test results. After the age of 22, women should have HPV testing at the same frequency as a Pap test.  Colorectal cancer can be detected and often prevented. Most routine colorectal cancer screening begins at the age of 47 years and continues through age 64 years. However, your health care provider may recommend screening at an earlier age if you have risk factors for colon cancer. On a yearly basis, your health care provider may provide home test kits to check for hidden blood in the stool. Use of a small camera at the end of a tube, to directly examine the colon (sigmoidoscopy or colonoscopy), can detect the earliest forms of colorectal cancer. Talk to your health care provider about this at age 23, when routine screening begins. Direct exam of the colon should be repeated every 5-10 years through age 39 years, unless early forms of pre-cancerous polyps or small growths are found.  People who are at an increased risk for hepatitis B should be screened for this virus. You are considered at high risk for hepatitis B if:  You were born in a country where hepatitis B occurs often. Talk with your health care provider about which countries are considered high risk.  Your parents were born in a high-risk country and you have not received a shot to protect against hepatitis B (hepatitis B vaccine).  You have HIV or AIDS.  You use needles to inject street drugs.  You live with, or have sex with, someone who has hepatitis B.  You get hemodialysis treatment.  You take certain medicines for conditions like cancer, organ transplantation, and autoimmune conditions.  Hepatitis C blood testing is recommended for all people born from [birth year] through 1967 and any individual with known risks for hepatitis C.  Practice safe sex.  Use condoms and avoid high-risk sexual practices to reduce the spread of sexually transmitted infections (STIs). STIs include gonorrhea, chlamydia, syphilis, trichomonas, herpes, HPV, and human immunodeficiency virus (HIV). Herpes, HIV, and HPV are viral illnesses that have no cure. They can result in disability, cancer, and death.  You should be screened for sexually transmitted illnesses (STIs) including gonorrhea and chlamydia if:  You are sexually active and are younger than 24 years.  You are older than 24 years and your health care provider tells you that you are at risk for this type of infection.  Your sexual activity has changed since you were last screened and you are at an increased risk for chlamydia or gonorrhea. Ask your health care provider if you are at risk.  If you are at risk of being infected with HIV, it is recommended that you take a prescription medicine daily to prevent HIV infection. This is called preexposure prophylaxis (PrEP). You are considered at risk if:  You are a heterosexual woman, are sexually active, and are at increased  risk for HIV infection.  You take drugs by injection.  You are sexually active with a partner who has HIV.  Talk with your health care provider about whether you are at high risk of being infected with HIV. If you choose to begin PrEP, you should first be tested for HIV. You should then be tested every 3 months for as long as you are taking PrEP.  Osteoporosis is a disease in which the bones lose minerals and strength with aging. This can result in serious bone fractures or breaks. The risk of osteoporosis can be identified using a bone density scan. Women ages 13 years and over and women at risk for fractures or osteoporosis should discuss screening with their health care providers. Ask your health care provider whether you should take a calcium supplement or vitamin D to reduce the rate of osteoporosis.  Menopause can be associated with  physical symptoms and risks. Hormone replacement therapy is available to decrease symptoms and risks. You should talk to your health care provider about whether hormone replacement therapy is right for you.  Use sunscreen. Apply sunscreen liberally and repeatedly throughout the day. You should seek shade when your shadow is shorter than you. Protect yourself by wearing long sleeves, pants, a wide-brimmed hat, and sunglasses year round, whenever you are outdoors.  Once a month, do a whole body skin exam, using a mirror to look at the skin on your back. Tell your health care provider of new moles, moles that have irregular borders, moles that are larger than a pencil eraser, or moles that have changed in shape or color.  Stay current with required vaccines (immunizations).  Influenza vaccine. All adults should be immunized every year.  Tetanus, diphtheria, and acellular pertussis (Td, Tdap) vaccine. Pregnant women should receive 1 dose of Tdap vaccine during each pregnancy. The dose should be obtained regardless of the length of time since the last dose. Immunization is preferred during the 27th-36th week of gestation. An adult who has not previously received Tdap or who does not know her vaccine status should receive 1 dose of Tdap. This initial dose should be followed by tetanus and diphtheria toxoids (Td) booster doses every 10 years. Adults with an unknown or incomplete history of completing a 3-dose immunization series with Td-containing vaccines should begin or complete a primary immunization series including a Tdap dose. Adults should receive a Td booster every 10 years.  Varicella vaccine. An adult without evidence of immunity to varicella should receive 2 doses or a second dose if she has previously received 1 dose. Pregnant females who do not have evidence of immunity should receive the first dose after pregnancy. This first dose should be obtained before leaving the health care facility. The  second dose should be obtained 4-8 weeks after the first dose.  Human papillomavirus (HPV) vaccine. Females aged 13-26 years who have not received the vaccine previously should obtain the 3-dose series. The vaccine is not recommended for use in pregnant females. However, pregnancy testing is not needed before receiving a dose. If a female is found to be pregnant after receiving a dose, no treatment is needed. In that case, the remaining doses should be delayed until after the pregnancy. Immunization is recommended for any person with an immunocompromised condition through the age of 52 years if she did not get any or all doses earlier. During the 3-dose series, the second dose should be obtained 4-8 weeks after the first dose. The third dose should be obtained  24 weeks after the first dose and 16 weeks after the second dose.  Zoster vaccine. One dose is recommended for adults aged 56 years or older unless certain conditions are present.  Measles, mumps, and rubella (MMR) vaccine. Adults born before [birth year] generally are considered immune to measles and mumps. Adults born in [birth year] or later should have 1 or more doses of MMR vaccine unless there is a contraindication to the vaccine or there is laboratory evidence of immunity to each of the three diseases. A routine second dose of MMR vaccine should be obtained at least 28 days after the first dose for students attending postsecondary schools, health care workers, or international travelers. People who received inactivated measles vaccine or an unknown type of measles vaccine during 1963-1967 should receive 2 doses of MMR vaccine. People who received inactivated mumps vaccine or an unknown type of mumps vaccine before 1979 and are at high risk for mumps infection should consider immunization with 2 doses of MMR vaccine. For females of childbearing age, rubella immunity should be determined. If there is no evidence of immunity, females who are not pregnant should be  vaccinated. If there is no evidence of immunity, females who are pregnant should delay immunization until after pregnancy. Unvaccinated health care workers born before [birth year] who lack laboratory evidence of measles, mumps, or rubella immunity or laboratory confirmation of disease should consider measles and mumps immunization with 2 doses of MMR vaccine or rubella immunization with 1 dose of MMR vaccine.  Pneumococcal 13-valent conjugate (PCV13) vaccine. When indicated, a person who is uncertain of her immunization history and has no record of immunization should receive the PCV13 vaccine. An adult aged 30 years or older who has certain medical conditions and has not been previously immunized should receive 1 dose of PCV13 vaccine. This PCV13 should be followed with a dose of pneumococcal polysaccharide (PPSV23) vaccine. The PPSV23 vaccine dose should be obtained at least 8 weeks after the dose of PCV13 vaccine. An adult aged 12 years or older who has certain medical conditions and previously received 1 or more doses of PPSV23 vaccine should receive 1 dose of PCV13. The PCV13 vaccine dose should be obtained 1 or more years after the last PPSV23 vaccine dose.  Pneumococcal polysaccharide (PPSV23) vaccine. When PCV13 is also indicated, PCV13 should be obtained first. All adults aged 60 years and older should be immunized. An adult younger than age 52 years who has certain medical conditions should be immunized. Any person who resides in a nursing home or long-term care facility should be immunized. An adult smoker should be immunized. People with an immunocompromised condition and certain other conditions should receive both PCV13 and PPSV23 vaccines. People with human immunodeficiency virus (HIV) infection should be immunized as soon as possible after diagnosis. Immunization during chemotherapy or radiation therapy should be avoided. Routine use of PPSV23 vaccine is not recommended for American Indians, [place]  Natives, or people younger than 65 years unless there are medical conditions that require PPSV23 vaccine. When indicated, people who have unknown immunization and have no record of immunization should receive PPSV23 vaccine. One-time revaccination 5 years after the first dose of PPSV23 is recommended for people aged 19-64 years who have chronic kidney failure, nephrotic syndrome, asplenia, or immunocompromised conditions. People who received 1-2 doses of PPSV23 before age 61 years should receive another dose of PPSV23 vaccine at age 70 years or later if at least 5 years have passed since the previous dose. Doses of PPSV23 are not  needed for people immunized with PPSV23 at or after age 26 years.  Meningococcal vaccine. Adults with asplenia or persistent complement component deficiencies should receive 2 doses of quadrivalent meningococcal conjugate (MenACWY-D) vaccine. The doses should be obtained at least 2 months apart. Microbiologists working with certain meningococcal bacteria, [place] recruits, people at risk during an outbreak, and people who travel to or live in countries with a high rate of meningitis should be immunized. A first-year college student up through age 72 years who is living in a residence hall should receive a dose if she did not receive a dose on or after her 16th birthday. Adults who have certain high-risk conditions should receive one or more doses of vaccine.  Hepatitis A vaccine. Adults who wish to be protected from this disease, have certain high-risk conditions, work with hepatitis A-infected animals, work in hepatitis A research labs, or travel to or work in countries with a high rate of hepatitis A should be immunized. Adults who were previously unvaccinated and who anticipate close contact with an international adoptee during the first 60 days after arrival in the [place] from a country with a high rate of hepatitis A should be immunized.  Hepatitis B vaccine. Adults who  wish to be protected from this disease, have certain high-risk conditions, may be exposed to blood or other infectious body fluids, are household contacts or sex partners of hepatitis B positive people, are clients or workers in certain care facilities, or travel to or work in countries with a high rate of hepatitis B should be immunized.  Haemophilus influenzae type b (Hib) vaccine. A previously unvaccinated person with asplenia or sickle cell disease or having a scheduled splenectomy should receive 1 dose of Hib vaccine. Regardless of previous immunization, a recipient of a hematopoietic stem cell transplant should receive a 3-dose series 6-12 months after her successful transplant. Hib vaccine is not recommended for adults with HIV infection. Preventive Services / Frequency Ages 82 to 5 years  Blood pressure check.** / Every 1 to 2 years.  Lipid and cholesterol check.** / Every 5 years beginning at age 63.  Clinical breast exam.** / Every 3 years for women in their 61s and 58s.  BRCA-related cancer risk assessment.** / For women who have family members with a BRCA-related cancer (breast, ovarian, tubal, or peritoneal cancers).  Pap test.** / Every 2 years from ages 54 through 56. Every 3 years starting at age 11 through age 60 or 8 with a history of 3 consecutive normal Pap tests.  HPV screening.** / Every 3 years from ages 2 through ages 60 to 107 with a history of 3 consecutive normal Pap tests.  Hepatitis C blood test.** / For any individual with known risks for hepatitis C.  Skin self-exam. / Monthly.  Influenza vaccine. / Every year.  Tetanus, diphtheria, and acellular pertussis (Tdap, Td) vaccine.** / Consult your health care provider. Pregnant women should receive 1 dose of Tdap vaccine during each pregnancy. 1 dose of Td every 10 years.  Varicella vaccine.** / Consult your health care provider. Pregnant females who do not have evidence of immunity should receive the first dose  after pregnancy.  HPV vaccine. / 3 doses over 6 months, if 73 and younger. The vaccine is not recommended for use in pregnant females. However, pregnancy testing is not needed before receiving a dose.  Measles, mumps, rubella (MMR) vaccine.** / You need at least 1 dose of MMR if you were born in 1957 or later. You  may also need a 2nd dose. For females of childbearing age, rubella immunity should be determined. If there is no evidence of immunity, females who are not pregnant should be vaccinated. If there is no evidence of immunity, females who are pregnant should delay immunization until after pregnancy.  Pneumococcal 13-valent conjugate (PCV13) vaccine.** / Consult your health care provider.  Pneumococcal polysaccharide (PPSV23) vaccine.** / 1 to 2 doses if you smoke cigarettes or if you have certain conditions.  Meningococcal vaccine.** / 1 dose if you are age 22 to 32 years and a Market researcher living in a residence hall, or have one of several medical conditions, you need to get vaccinated against meningococcal disease. You may also need additional booster doses.  Hepatitis A vaccine.** / Consult your health care provider.  Hepatitis B vaccine.** / Consult your health care provider.  Haemophilus influenzae type b (Hib) vaccine.** / Consult your health care provider. Ages 53 to 41 years  Blood pressure check.** / Every 1 to 2 years.  Lipid and cholesterol check.** / Every 5 years beginning at age 71 years.  Lung cancer screening. / Every year if you are aged 50-80 years and have a 30-pack-year history of smoking and currently smoke or have quit within the past 15 years. Yearly screening is stopped once you have quit smoking for at least 15 years or develop a health problem that would prevent you from having lung cancer treatment.  Clinical breast exam.** / Every year after age 38 years.  BRCA-related cancer risk assessment.** / For women who have family members with a  BRCA-related cancer (breast, ovarian, tubal, or peritoneal cancers).  Mammogram.** / Every year beginning at age 73 years and continuing for as long as you are in good health. Consult with your health care provider.  Pap test.** / Every 3 years starting at age 69 years through age 63 or 16 years with a history of 3 consecutive normal Pap tests.  HPV screening.** / Every 3 years from ages 21 years through ages 29 to 55 years with a history of 3 consecutive normal Pap tests.  Fecal occult blood test (FOBT) of stool. / Every year beginning at age 44 years and continuing until age 32 years. You may not need to do this test if you get a colonoscopy every 10 years.  Flexible sigmoidoscopy or colonoscopy.** / Every 5 years for a flexible sigmoidoscopy or every 10 years for a colonoscopy beginning at age 51 years and continuing until age 44 years.  Hepatitis C blood test.** / For all people born from [birth year] through 1967 and any individual with known risks for hepatitis C.  Skin self-exam. / Monthly.  Influenza vaccine. / Every year.  Tetanus, diphtheria, and acellular pertussis (Tdap/Td) vaccine.** / Consult your health care provider. Pregnant women should receive 1 dose of Tdap vaccine during each pregnancy. 1 dose of Td every 10 years.  Varicella vaccine.** / Consult your health care provider. Pregnant females who do not have evidence of immunity should receive the first dose after pregnancy.  Zoster vaccine.** / 1 dose for adults aged 53 years or older.  Measles, mumps, rubella (MMR) vaccine.** / You need at least 1 dose of MMR if you were born in 1957 or later. You may also need a 2nd dose. For females of childbearing age, rubella immunity should be determined. If there is no evidence of immunity, females who are not pregnant should be vaccinated. If there is no evidence of immunity, females who are  pregnant should delay immunization until after pregnancy.  Pneumococcal 13-valent conjugate  (PCV13) vaccine.** / Consult your health care provider.  Pneumococcal polysaccharide (PPSV23) vaccine.** / 1 to 2 doses if you smoke cigarettes or if you have certain conditions.  Meningococcal vaccine.** / Consult your health care provider.  Hepatitis A vaccine.** / Consult your health care provider.  Hepatitis B vaccine.** / Consult your health care provider.  Haemophilus influenzae type b (Hib) vaccine.** / Consult your health care provider. Ages 27 years and over  Blood pressure check.** / Every 1 to 2 years.  Lipid and cholesterol check.** / Every 5 years beginning at age 45 years.  Lung cancer screening. / Every year if you are aged 89-80 years and have a 30-pack-year history of smoking and currently smoke or have quit within the past 15 years. Yearly screening is stopped once you have quit smoking for at least 15 years or develop a health problem that would prevent you from having lung cancer treatment.  Clinical breast exam.** / Every year after age 2 years.  BRCA-related cancer risk assessment.** / For women who have family members with a BRCA-related cancer (breast, ovarian, tubal, or peritoneal cancers).  Mammogram.** / Every year beginning at age 37 years and continuing for as long as you are in good health. Consult with your health care provider.  Pap test.** / Every 3 years starting at age 34 years through age 64 or 70 years with 3 consecutive normal Pap tests. Testing can be stopped between 65 and 70 years with 3 consecutive normal Pap tests and no abnormal Pap or HPV tests in the past 10 years.  HPV screening.** / Every 3 years from ages 101 years through ages 47 or 26 years with a history of 3 consecutive normal Pap tests. Testing can be stopped between 65 and 70 years with 3 consecutive normal Pap tests and no abnormal Pap or HPV tests in the past 10 years.  Fecal occult blood test (FOBT) of stool. / Every year beginning at age 72 years and continuing until age 53  years. You may not need to do this test if you get a colonoscopy every 10 years.  Flexible sigmoidoscopy or colonoscopy.** / Every 5 years for a flexible sigmoidoscopy or every 10 years for a colonoscopy beginning at age 73 years and continuing until age 32 years.  Hepatitis C blood test.** / For all people born from [birth year] through 1965 and any individual with known risks for hepatitis C.  Osteoporosis screening.** / A one-time screening for women ages 17 years and over and women at risk for fractures or osteoporosis.  Skin self-exam. / Monthly.  Influenza vaccine. / Every year.  Tetanus, diphtheria, and acellular pertussis (Tdap/Td) vaccine.** / 1 dose of Td every 10 years.  Varicella vaccine.** / Consult your health care provider.  Zoster vaccine.** / 1 dose for adults aged 67 years or older.  Pneumococcal 13-valent conjugate (PCV13) vaccine.** / Consult your health care provider.  Pneumococcal polysaccharide (PPSV23) vaccine.** / 1 dose for all adults aged 39 years and older.  Meningococcal vaccine.** / Consult your health care provider.  Hepatitis A vaccine.** / Consult your health care provider.  Hepatitis B vaccine.** / Consult your health care provider.  Haemophilus influenzae type b (Hib) vaccine.** / Consult your health care provider. ** Family history and personal history of risk and conditions may change your health care provider's recommendations. Document Released: 04/13/2001 Document Revised: 07/02/2013 Document Reviewed: 07/13/2010 [facility] Patient Information 2015 [place], [place].  This information is not intended to replace advice given to you by your health care provider. Make sure you discuss any questions you have with your health care provider. DASH Eating Plan DASH stands for "Dietary Approaches to Stop Hypertension." The DASH eating plan is a healthy eating plan that has been shown to reduce high blood pressure (hypertension). Additional health benefits may  include reducing the risk of type 2 diabetes mellitus, heart disease, and stroke. The DASH eating plan may also help with weight loss. WHAT DO I NEED TO KNOW ABOUT THE DASH EATING PLAN? For the DASH eating plan, you will follow these general guidelines:  Choose foods with a percent daily value for sodium of less than 5% (as listed on the food label).  Use salt-free seasonings or herbs instead of table salt or sea salt.  Check with your health care provider or pharmacist before using salt substitutes.  Eat lower-sodium products, often labeled as "lower sodium" or "no salt added."  Eat fresh foods.  Eat more vegetables, fruits, and low-fat dairy products.  Choose whole grains. Look for the word "whole" as the first word in the ingredient list.  Choose fish and skinless chicken or [place] more often than red meat. Limit fish, poultry, and meat to 6 oz (170 g) each day.  Limit sweets, desserts, sugars, and sugary drinks.  Choose heart-healthy fats.  Limit cheese to 1 oz (28 g) per day.  Eat more home-cooked food and less restaurant, buffet, and fast food.  Limit fried foods.  Cook foods using methods other than frying.  Limit canned vegetables. If you do use them, rinse them well to decrease the sodium.  When eating at a restaurant, ask that your food be prepared with less salt, or no salt if possible. WHAT FOODS CAN I EAT? Seek help from a dietitian for individual calorie needs. Grains Whole grain or whole wheat bread. Brown rice. Whole grain or whole wheat pasta. Quinoa, bulgur, and whole grain cereals. Low-sodium cereals. Corn or whole wheat flour tortillas. Whole grain cornbread. Whole grain crackers. Low-sodium crackers. Vegetables Fresh or frozen vegetables (raw, steamed, roasted, or grilled). Low-sodium or reduced-sodium tomato and vegetable juices. Low-sodium or reduced-sodium tomato sauce and paste. Low-sodium or reduced-sodium canned vegetables.  Fruits All fresh,  canned (in natural juice), or frozen fruits. Meat and Other Protein Products Ground beef (85% or leaner), grass-fed beef, or beef trimmed of fat. Skinless chicken or [place]. Ground chicken or [place]. Pork trimmed of fat. All fish and seafood. Eggs. Dried beans, peas, or lentils. Unsalted nuts and seeds. Unsalted canned beans. Dairy Low-fat dairy products, such as skim or 1% milk, 2% or reduced-fat cheeses, low-fat ricotta or cottage cheese, or plain low-fat yogurt. Low-sodium or reduced-sodium cheeses. Fats and Oils Tub margarines without trans fats. Light or reduced-fat mayonnaise and salad dressings (reduced sodium). Avocado. Safflower, olive, or canola oils. Natural peanut or almond butter. Other Unsalted popcorn and pretzels. The items listed above may not be a complete list of recommended foods or beverages. Contact your dietitian for more options. WHAT FOODS ARE NOT RECOMMENDED? Grains White bread. White pasta. White rice. Refined cornbread. Bagels and croissants. Crackers that contain trans fat. Vegetables Creamed or fried vegetables. Vegetables in a cheese sauce. Regular canned vegetables. Regular canned tomato sauce and paste. Regular tomato and vegetable juices. Fruits Dried fruits. Canned fruit in light or heavy syrup. Fruit juice. Meat and Other Protein Products Fatty cuts of meat. Ribs, chicken wings, bacon, sausage, bologna, salami, chitterlings, fatback,  hot dogs, bratwurst, and packaged luncheon meats. Salted nuts and seeds. Canned beans with salt. Dairy Whole or 2% milk, cream, half-and-half, and cream cheese. Whole-fat or sweetened yogurt. Full-fat cheeses or blue cheese. Nondairy creamers and whipped toppings. Processed cheese, cheese spreads, or cheese curds. Condiments Onion and garlic salt, seasoned salt, table salt, and sea salt. Canned and packaged gravies. Worcestershire sauce. Tartar sauce. Barbecue sauce. Teriyaki sauce. Soy sauce, including reduced sodium. Steak  sauce. Fish sauce. Oyster sauce. Cocktail sauce. Horseradish. Ketchup and mustard. Meat flavorings and tenderizers. Bouillon cubes. Hot sauce. Tabasco sauce. Marinades. Taco seasonings. Relishes. Fats and Oils Butter, stick margarine, lard, shortening, ghee, and bacon fat. Coconut, palm kernel, or palm oils. Regular salad dressings. Other Pickles and olives. Salted popcorn and pretzels. The items listed above may not be a complete list of foods and beverages to avoid. Contact your dietitian for more information. WHERE CAN I FIND MORE INFORMATION? National Heart, [employer], and [employer] Institute: travelstabloid.com Document Released: 02/04/2011 Document Revised: 07/02/2013 Document Reviewed: 12/20/2012 [facility] Patient Information 2015 [place], [place]. This information is not intended to replace advice given to you by your health care provider. Make sure you discuss any questions you have with your health care provider.

## 2014-07-01 ENCOUNTER — Ambulatory Visit (INDEPENDENT_AMBULATORY_CARE_PROVIDER_SITE_OTHER): Payer: Medicare Other | Admitting: Pharmacist

## 2014-07-01 ENCOUNTER — Encounter: Payer: Self-pay | Admitting: Pharmacist

## 2014-07-01 VITALS — Ht 64.5 in | Wt 140.0 lb

## 2014-07-01 DIAGNOSIS — M810 Age-related osteoporosis without current pathological fracture: Secondary | ICD-10-CM | POA: Diagnosis not present

## 2014-07-01 MED ORDER — ASPIRIN EC 81 MG PO TBEC: 81.0000 mg | DELAYED_RELEASE_TABLET | Freq: Every day | ORAL | Status: AC

## 2014-07-01 MED ORDER — ALENDRONATE SODIUM 70 MG PO TABS: 70.0000 mg | ORAL_TABLET | ORAL | Status: DC

## 2014-07-01 NOTE — Progress Notes (Signed)
Patient ID: Lindsey May, female   DOB: 02/09/1951, 64 y.o.   MRN: [id number]  [facility] Current Height:        Max Lifetime Height:  5\' 8"  Current Weight:         Ethnicity:Caucasian    HPI: Does pt already have a diagnosis of:  Osteopenia?  No Osteoporosis?  No  Back Pain?  No       Kyphosis?  No Prior fracture?  No - not as adult.  Broke clavicle when she was 89 or 64 years old Med(s) for Osteoporosis/Osteopenia:  none Med(s) previously tried for Osteoporosis/Osteopenia:  none                                                             PMH: Age at menopause:  64 yo Hysterectomy?  No Oophorectomy?  No HRT? No Steroid Use?  Yes - Current.  Type/duration: inhaled corticosteroids Thyroid med?  No History of cancer?  No History of digestive disorders (ie Crohn's)?  No Current or previous eating disorders?  No Last Vitamin D Result:  25 (04/01/2014) Last GFR Result: 76 (04/01/2014)   FH/SH: Family history of osteoporosis?  No Parent with history of hip fracture?  No Family history of breast cancer?  No Exercise?  No Smoking?  No Alcohol?  No    Calcium Assessment Calcium Intake  # of servings/day  Calcium mg  Milk (8 oz) 0  x  300  = 0  Yogurt (4 oz) 0 x  200 = 0  Cheese (1 oz) 0 x  200 = 0  Other Calcium sources   250mg   Ca supplement MVI = 400mg    Estimated calcium intake per day 650mg     DEXA Results Date of Test T-Score for AP Spine L1-L4 T-Score for Total Left Hip T-Score for Total Right Hip  06/25/2014 -3.3 -3.8 -3.9                  Assessment: Osteoporosis   Recommendations: 1.  Start  alendronate (FOSAMAX) 70mg  1 tablet weekly 2.  recommend calcium 1200mg  daily through supplementation or diet.  3.  recommend weight bearing exercise - 30 minutes at least 4 days per week.   4.  Counseled and educated about fall risk and prevention. 5.  Change from ASA 325mg  to 81mg  daily  Recheck DEXA:  2 years  Time spent counseling patient:  30 minutes    Lindsey May, PharmD, CPP

## 2014-07-01 NOTE — Patient Instructions (Signed)
Fall Prevention and Home Safety Falls cause injuries and can affect all age groups. It is possible to use preventive measures to significantly decrease the likelihood of falls. There are many simple measures which can make your home safer and prevent falls. OUTDOORS  Repair cracks and edges of walkways and driveways.  Remove high doorway thresholds.  Trim shrubbery on the main path into your home.  Have good outside lighting.  Clear walkways of tools, rocks, debris, and clutter.  Check that handrails are not broken and are securely fastened. Both sides of steps should have handrails.  Have leaves, snow, and ice cleared regularly.  Use sand or salt on walkways during winter months.  In the garage, clean up grease or oil spills. BATHROOM  Install night lights.  Install grab bars by the toilet and in the tub and shower.  Use non-skid mats or decals in the tub or shower.  Place a plastic non-slip stool in the shower to sit on, if needed.  Keep floors dry and clean up all water on the floor immediately.  Remove soap buildup in the tub or shower on a regular basis.  Secure bath mats with non-slip, double-sided rug tape.  Remove throw rugs and tripping hazards from the floors. BEDROOMS  Install night lights.  Make sure a bedside light is easy to reach.  Do not use oversized bedding.  Keep a telephone by your bedside.  Have a firm chair with side arms to use for getting dressed.  Remove throw rugs and tripping hazards from the floor. KITCHEN  Keep handles on pots and pans turned toward the center of the stove. Use back burners when possible.  Clean up spills quickly and allow time for drying.  Avoid walking on wet floors.  Avoid hot utensils and knives.  Position shelves so they are not too high or low.  Place commonly used objects within easy reach.  If necessary, use a sturdy step stool with a grab bar when reaching.  Keep electrical cables out of the  way.  Do not use floor polish or wax that makes floors slippery. If you must use wax, use non-skid floor wax.  Remove throw rugs and tripping hazards from the floor. STAIRWAYS  Never leave objects on stairs.  Place handrails on both sides of stairways and use them. Fix any loose handrails. Make sure handrails on both sides of the stairways are as long as the stairs.  Check carpeting to make sure it is firmly attached along stairs. Make repairs to worn or loose carpet promptly.  Avoid placing throw rugs at the top or bottom of stairways, or properly secure the rug with carpet tape to prevent slippage. Get rid of throw rugs, if possible.  Have an electrician put in a light switch at the top and bottom of the stairs. OTHER FALL PREVENTION TIPS  Wear low-heel or rubber-soled shoes that are supportive and fit well. Wear closed toe shoes.  When using a stepladder, make sure it is fully opened and both spreaders are firmly locked. Do not climb a closed stepladder.  Add color or contrast paint or tape to grab bars and handrails in your home. Place contrasting color strips on first and last steps.  Learn and use mobility aids as needed. Install an electrical emergency response system.  Turn on lights to avoid dark areas. Replace light bulbs that burn out immediately. Get light switches that glow.  Arrange furniture to create clear pathways. Keep furniture in the same place.    Firmly attach carpet with non-skid or double-sided tape.  Eliminate uneven floor surfaces.  Select a carpet pattern that does not visually hide the edge of steps.  Be aware of all pets. OTHER HOME SAFETY TIPS  Set the water temperature for 120 F (48.8 C).  Keep emergency numbers on or near the telephone.  Keep smoke detectors on every level of the home and near sleeping areas. Document Released: 02/05/2002 Document Revised: 08/17/2011 Document Reviewed: 05/07/2011 ExitCare Patient Information 2015  [employer]. This information is not intended to replace advice given to you by your health care provider. Make sure you discuss any questions you have with your health care provider.                Exercise for Strong Bones  Exercise is important to build and maintain strong bones / bone density.  There are 2 types of exercises that are important to building and maintaining strong bones:  Weight- bearing and muscle-stregthening.  Weight-bearing Exercises  These exercises include activities that make you move against gravity while staying upright. Weight-bearing exercises can be high-impact or low-impact.  High-impact weight-bearing exercises help build bones and keep them strong. If you have broken a bone due to osteoporosis or are at risk of breaking a bone, you may need to avoid high-impact exercises. If you're not sure, you should check with your healthcare provider.  Examples of high-impact weight-bearing exercises are: Dancing  Doing high-impact aerobics  Hiking  Jogging/running  Jumping Rope  Stair climbing  Tennis  Low-impact weight-bearing exercises can also help keep bones strong and are a safe alternative if you cannot do high-impact exercises.   Examples of low-impact weight-bearing exercises are: Using elliptical training machines  Doing low-impact aerobics  Using stair-step machines  Fast walking on a treadmill or outside   Muscle-Strengthening Exercises These exercises include activities where you move your body, a weight or some other resistance against gravity. They are also known as resistance exercises and include: Lifting weights  Using elastic exercise bands  Using weight machines  Lifting your own body weight  Functional movements, such as standing and rising up on your toes  Yoga and Pilates can also improve strength, balance and flexibility. However, certain positions may not be safe for people with osteoporosis or those at increased risk of broken  bones. For example, exercises that have you bend forward may increase the chance of breaking a bone in the spine.   Non-Impact Exercises There are other types of exercises that can help prevent falls.  Non-impact exercises can help you to improve balance, posture and how well you move in everyday activities. Some of these exercises include: Balance exercises that strengthen your legs and test your balance, such as Tai Chi, can decrease your risk of falls.  Posture exercises that improve your posture and reduce rounded or "sloping" shoulders can help you decrease the chance of breaking a bone, especially in the spine.  Functional exercises that improve how well you move can help you with everyday activities and decrease your chance of falling and breaking a bone. For example, if you have trouble getting up from a chair or climbing stairs, you should do these activities as exercises.   **A physical therapist can teach you balance, posture and functional exercises. He/she can also help you learn which exercises are safe and appropriate for you.  Lindsey May has a physical therapy office in [place] in front of our office and referrals can be made for assessments   and treatment as needed and strength and balance training.  If you would like to have an assessment with Chad and our physical therapy team please let a nurse or provider know.    

## 2014-07-15 ENCOUNTER — Telehealth: Payer: Self-pay | Admitting: Family

## 2014-07-15 NOTE — Telephone Encounter (Signed)
Patient aware.

## 2014-07-15 NOTE — Telephone Encounter (Signed)
Pt can get Vit D OTC and take BID

## 2014-07-17 ENCOUNTER — Ambulatory Visit (INDEPENDENT_AMBULATORY_CARE_PROVIDER_SITE_OTHER): Payer: Medicare Other | Admitting: Physician Assistant

## 2014-07-17 ENCOUNTER — Encounter: Payer: Self-pay | Admitting: Physician Assistant

## 2014-07-17 VITALS — BP 116/65 | HR 76 | Temp 97.4°F | Ht 64.5 in | Wt 142.0 lb

## 2014-07-17 DIAGNOSIS — H8110 Benign paroxysmal vertigo, unspecified ear: Secondary | ICD-10-CM

## 2014-07-17 DIAGNOSIS — J0100 Acute maxillary sinusitis, unspecified: Secondary | ICD-10-CM

## 2014-07-17 MED ORDER — AMOXICILLIN 875 MG PO TABS: 875.0000 mg | ORAL_TABLET | Freq: Two times a day (BID) | ORAL | Status: DC

## 2014-07-17 MED ORDER — MECLIZINE HCL 25 MG PO TABS: 25.0000 mg | ORAL_TABLET | Freq: Three times a day (TID) | ORAL | Status: DC | PRN

## 2014-07-17 MED ORDER — FLUTICASONE PROPIONATE 50 MCG/ACT NA SUSP: 2.0000 | Freq: Every day | NASAL | Status: DC

## 2014-07-17 NOTE — Progress Notes (Signed)
   Subjective:    Patient ID: Lindsey May, female    DOB: 1950/12/28, 64 y.o.   MRN: [id number]  HPI 64 y/o female presents with c/o cough, facial pain x 4 days. Has tried inhalers, tylenol, guaifenesin with no relief.     Review of Systems  Constitutional: Positive for fever (102F), chills, diaphoresis and fatigue.  HENT: Positive for congestion (nasal ), postnasal drip, rhinorrhea, sinus pressure, sneezing and sore throat.   Respiratory: Positive for cough (intermittently productive ), shortness of breath and wheezing.   All other systems reviewed and are negative.      Objective:   Physical Exam  Constitutional: She is oriented to person, place, and time. No distress.  Frail   HENT:  Head: Normocephalic.  TTP on bilateral maxillary sinuses Posterior pharynx erythema bilaterally   Cardiovascular: Normal rate, regular rhythm and normal heart sounds.  Exam reveals no gallop and no friction rub.   No murmur heard. Pulmonary/Chest: Effort normal and breath sounds normal. She has no wheezes.  Neurological: She is alert and oriented to person, place, and time.  Skin: She is not diaphoretic.  Psychiatric: She has a normal mood and affect. Her behavior is normal. Judgment and thought content normal.  Nursing note and vitals reviewed.         Assessment & Plan:  1. Acute maxillary sinusitis, recurrence not specified  - amoxicillin (AMOXIL) 875 MG tablet; Take 1 tablet (875 mg total) by mouth 2 (two) times daily.  Dispense: 20 tablet; Refill: 0 - fluticasone (FLONASE) 50 MCG/ACT nasal spray; Place 2 sprays into both nostrils daily.  Dispense: 16 g; Refill: 6  2. Benign paroxysmal positional vertigo, unspecified laterality  - meclizine (ANTIVERT) 25 MG tablet; Take 1 tablet (25 mg total) by mouth 3 (three) times daily as needed for dizziness.  Dispense: 30 tablet; Refill: 2   Cool mist humidifier - OTC plain musinex - Tyelenol or motrin for pain re  Fread Kottke A. Chauncey ReadingGann PA-C

## 2014-07-26 ENCOUNTER — Other Ambulatory Visit: Payer: Self-pay | Admitting: *Deleted

## 2014-07-26 ENCOUNTER — Ambulatory Visit (INDEPENDENT_AMBULATORY_CARE_PROVIDER_SITE_OTHER): Payer: Medicare Other | Admitting: *Deleted

## 2014-07-26 DIAGNOSIS — Z23 Encounter for immunization: Secondary | ICD-10-CM | POA: Diagnosis not present

## 2014-07-26 MED ORDER — POLYMYXIN B-TRIMETHOPRIM 10000-0.1 UNIT/ML-% OP SOLN: 1.0000 [drp] | OPHTHALMIC | Status: DC

## 2014-07-26 NOTE — Patient Instructions (Signed)
Pneumococcal Conjugate Vaccine: What You Need to Know  Your doctor recommends that you, or your child, get a dose of PCV13 today.  1. Why get vaccinated?  Pneumococcal conjugate vaccine (called PCV13 or Prevnar 13) is recommended to protect infants and toddlers, and some older children and adults with certain health conditions, from pneumococcal disease.  Pneumococcal disease is caused by infection with Streptococcus pneumoniae bacteria. These bacteria can spread from person to person through close contact.  Pneumococcal disease can lead to severe health problems, including pneumonia, blood infections, and meningitis.  Meningitis is an infection of the covering of the brain. Pneumococcal meningitis is fairly rare (less than 1 case per 100,000 people each year), but it leads to other health problems, including deafness and brain damage. In children, it is fatal in about 1 case out of 10.  Children younger than two are at higher risk for serious disease than older children.  People with certain medical conditions, people over age 65, and cigarette smokers are also at higher risk.  Before vaccine, pneumococcal infections caused many problems each year in the [place] in children younger than 5, including:  · more than 700 cases of meningitis,  · 13,000 blood infections,  · about 5 million ear infections, and  · about 200 deaths.  About 4,000 adults still die each year because of pneumococcal infections.  Pneumococcal infections can be hard to treat because some strains are resistant to antibiotics. This makes prevention through vaccination even more important.  2. PCV13 vaccine  There are more than 90 types of pneumococcal bacteria. PCV13 protects against 13 of them. These 13 strains cause most severe infections in children and about half of infections in adults.   PCV13 is routinely given to children at 2, 4, 6, and 12-15 months of age. Children in this age range are at greatest risk for serious diseases caused  by pneumococcal infection.  PCV13 vaccine may also be recommended for some older children or adults. Your doctor can give you details.  A second type of pneumococcal vaccine, called PPSV23, may also be given to some children and adults, including anyone over age 65. There is a separate Vaccine Information Statement for this vaccine.  3. Precautions   Anyone who has ever had a life-threatening allergic reaction to a dose of this vaccine, to an earlier pneumococcal vaccine called PCV7 (or Prevnar), or to any vaccine containing diphtheria toxoid (for example, DTaP), should not get PCV13.  Anyone with a severe allergy to any component of PCV13 should not get the vaccine. Tell your doctor if the person being vaccinated has any severe allergies.  If the person scheduled for vaccination is sick, your doctor might decide to reschedule the shot on another day.  Your doctor can give you more information about any of these precautions.  4. What are the risks of PCV13 vaccine?   With any medicine, including vaccines, there is a chance of side effects. These are usually mild and go away on their own, but serious reactions are also possible.  Reported problems associated with PCV13 vary by dose and age, but generally:  · About half of children became drowsy after the shot, had a temporary loss of appetite, or had redness or tenderness where the shot was given.  · About 1 out of 3 had swelling where the shot was given.  · About 1 out of 3 had a mild fever, and about 1 in 20 had a higher fever (over 102.2°F).  ·   Up to about 8 out of 10 became fussy or irritable.  Adults receiving the vaccine have reported redness, pain, and swelling where the shot was given. Mild fever, fatigue, headache, chills, or muscle pain have also been reported.  Life-threatening allergic reactions from any vaccine are very rare.  5. What if there is a serious reaction?  What should I look for?  · Look for anything that concerns you, such as signs of a  severe allergic reaction, very high fever, or behavior changes.  Signs of a severe allergic reaction can include hives, swelling of the face and throat, difficulty breathing, a fast heartbeat, dizziness, and weakness. These would start a few minutes to a few hours after the vaccination.  What should I do?  · If you think it is a severe allergic reaction or other emergency that can't wait, call 9-1-1 or get the person to the nearest hospital. Otherwise, call your doctor.  · Afterward, the reaction should be reported to the Vaccine Adverse Event Reporting System (VAERS). Your doctor might file this report, or you can do it yourself through the VAERS web site at www.vaers.hhs.gov, or by calling 1-800-822-7967.  VAERS is only for reporting reactions. They do not give medical advice.  6. The National Vaccine Injury Compensation Program  The National Vaccine Injury Compensation Program (VICP) is a federal program that was created to compensate people who may have been injured by certain vaccines.  Persons who believe they may have been injured by a vaccine can learn about the program and about filing a claim by calling 1-800-338-2382 or visiting the VICP website at www.hrsa.gov/vaccinecompensation.  7. How can I learn more?  · Ask your doctor.  · Call your local or state health department.  · Contact the Centers for Disease Control and Prevention (CDC):  ¨ Call 1-800-232-4636 (1-800-CDC-INFO) or  ¨ Visit CDC's website at www.cdc.gov/vaccines  CDC PCV13 Vaccine VIS (Interim) (04/28/11)  Document Released: 12/13/2005 Document Revised: 07/02/2013 Document Reviewed: 04/06/2013  ExitCare® Patient Information ©2015 [employer]. This information is not intended to replace advice given to you by your health care provider. Make sure you discuss any questions you have with your health care provider.

## 2014-09-09 ENCOUNTER — Other Ambulatory Visit: Payer: Self-pay | Admitting: Family

## 2014-09-11 ENCOUNTER — Ambulatory Visit (INDEPENDENT_AMBULATORY_CARE_PROVIDER_SITE_OTHER): Payer: Medicare Other | Admitting: Physician Assistant

## 2014-09-11 ENCOUNTER — Encounter: Payer: Self-pay | Admitting: Physician Assistant

## 2014-09-11 ENCOUNTER — Ambulatory Visit (INDEPENDENT_AMBULATORY_CARE_PROVIDER_SITE_OTHER): Payer: Medicare Other

## 2014-09-11 VITALS — BP 140/88 | HR 75 | Temp 97.8°F | Ht 64.5 in | Wt 142.0 lb

## 2014-09-11 DIAGNOSIS — N3000 Acute cystitis without hematuria: Secondary | ICD-10-CM

## 2014-09-11 DIAGNOSIS — M25571 Pain in right ankle and joints of right foot: Secondary | ICD-10-CM

## 2014-09-11 DIAGNOSIS — R109 Unspecified abdominal pain: Secondary | ICD-10-CM | POA: Diagnosis not present

## 2014-09-11 DIAGNOSIS — J0100 Acute maxillary sinusitis, unspecified: Secondary | ICD-10-CM | POA: Diagnosis not present

## 2014-09-11 LAB — POCT UA - MICROSCOPIC ONLY
Casts, Ur, LPF, POC: NEGATIVE
Crystals, Ur, HPF, POC: NEGATIVE
Mucus, UA: NEGATIVE
Yeast, UA: NEGATIVE

## 2014-09-11 LAB — POCT URINALYSIS DIPSTICK
BILIRUBIN UA: NEGATIVE
Glucose, UA: NEGATIVE
Ketones, UA: NEGATIVE
NITRITE UA: NEGATIVE
Protein, UA: NEGATIVE
Urobilinogen, UA: NEGATIVE
pH, UA: 5

## 2014-09-11 MED ORDER — AMOXICILLIN-POT CLAVULANATE 875-125 MG PO TABS: 1.0000 | ORAL_TABLET | Freq: Two times a day (BID) | ORAL | Status: DC

## 2014-09-11 NOTE — Progress Notes (Signed)
   Subjective:    Patient ID: [name], female    DOB: 07-26-1950, 64 y.o.   MRN: [id number]  HPI 64 y/o female with comorbid COPD, osteoporosis presents today with c/o low back pain x 2 weeks, worse with movement of standing or sitting. Has took Tylenol with no relief.   She also has c/o right foot pain x 2 weeks ago s/p hitting it over a chair.   Also has c/o headache, sinus pressure in maxillary sinuses x 1 week.  She has tried flonase with no relief     Review of Systems  Constitutional: Negative.   HENT: Positive for congestion (after taking breathing treatment, productive cough), ear pain, postnasal drip, rhinorrhea, sinus pressure and sore throat.   Eyes: Negative.   Respiratory: Positive for cough (chronic COPD ).   Musculoskeletal:       Bilateral low back pain, worse with sitting or standing Right foot pain on dorsal surface, 2/3 toe   Neurological: Positive for headaches.       Objective:   Physical Exam  Constitutional: She is oriented to person, place, and time. No distress.  Frail appearing   Cardiovascular: Normal rate.  Exam reveals no gallop and no friction rub.   No murmur heard. Pulmonary/Chest: She has no wheezes.  Musculoskeletal: Normal range of motion. She exhibits no edema.  Neurological: She is alert and oriented to person, place, and time.  Skin: She is not diaphoretic.  Psychiatric: She has a normal mood and affect. Her behavior is normal. Judgment and thought content normal.  Nursing note and vitals reviewed.         Assessment & Plan:  1. Pain in joint, ankle and foot, right  - DG Foot Complete Right; Future  2. Flank pain  - POCT UA - Microscopic Only - POCT urinalysis dipstick - amoxicillin-clavulanate (AUGMENTIN) 875-125 MG per tablet; Take 1 tablet by mouth 2 (two) times daily.  Dispense: 20 tablet; Refill: 0 - Urine culture  3. Acute cystitis without hematuria  - amoxicillin-clavulanate (AUGMENTIN) 875-125 MG per tablet;  Take 1 tablet by mouth 2 (two) times daily.  Dispense: 20 tablet; Refill: 0 - Urine culture  4. Acute maxillary sinusitis, recurrence not specified  - amoxicillin-clavulanate (AUGMENTIN) 875-125 MG per tablet; Take 1 tablet by mouth 2 (two) times daily.  Dispense: 20 tablet; Refill: 0   Continue all meds Labs pending Health Maintenance reviewed Diet and exercise encouraged RTO 2 weeks   Damire Remedios A. Chauncey ReadingGann PA-C

## 2014-09-13 LAB — URINE CULTURE

## 2014-09-16 ENCOUNTER — Encounter: Payer: Self-pay | Admitting: *Deleted

## 2014-09-25 ENCOUNTER — Other Ambulatory Visit: Payer: Self-pay | Admitting: Family

## 2014-09-25 ENCOUNTER — Telehealth: Payer: Self-pay | Admitting: *Deleted

## 2014-09-25 NOTE — Telephone Encounter (Signed)
Script called to vm. 

## 2014-09-25 NOTE — Telephone Encounter (Signed)
Last seen 04/16/14 for anxiety, last filled 09/11/14. Send to pool, call into [facility]

## 2014-09-30 ENCOUNTER — Ambulatory Visit: Payer: Medicare Other | Admitting: Physician Assistant

## 2014-10-01 ENCOUNTER — Ambulatory Visit (INDEPENDENT_AMBULATORY_CARE_PROVIDER_SITE_OTHER): Payer: Medicare Other | Admitting: Family

## 2014-10-01 ENCOUNTER — Encounter: Payer: Self-pay | Admitting: Family

## 2014-10-01 VITALS — BP 122/71 | HR 72 | Temp 97.0°F | Ht 64.5 in | Wt 142.0 lb

## 2014-10-01 DIAGNOSIS — J309 Allergic rhinitis, unspecified: Secondary | ICD-10-CM | POA: Diagnosis not present

## 2014-10-01 DIAGNOSIS — F411 Generalized anxiety disorder: Secondary | ICD-10-CM

## 2014-10-01 DIAGNOSIS — M810 Age-related osteoporosis without current pathological fracture: Secondary | ICD-10-CM

## 2014-10-01 DIAGNOSIS — J449 Chronic obstructive pulmonary disease, unspecified: Secondary | ICD-10-CM

## 2014-10-01 DIAGNOSIS — E559 Vitamin D deficiency, unspecified: Secondary | ICD-10-CM

## 2014-10-01 MED ORDER — MONTELUKAST SODIUM 10 MG PO TABS: 10.0000 mg | ORAL_TABLET | Freq: Every day | ORAL | Status: DC

## 2014-10-01 MED ORDER — LORAZEPAM 0.5 MG PO TABS: 0.5000 mg | ORAL_TABLET | Freq: Three times a day (TID) | ORAL | Status: DC | PRN

## 2014-10-01 NOTE — Patient Instructions (Signed)

## 2014-10-01 NOTE — Progress Notes (Signed)
Subjective:    Patient ID: Lindsey May, female    DOB: Mar 11, 1950, 64 y.o.   MRN: [id number]  Anxiety Presents for follow-up visit. Patient reports no depressed mood, dry mouth, excessive worry, insomnia, irritability, nervous/anxious behavior, palpitations, panic or shortness of breath. Symptoms occur rarely. The severity of symptoms is mild. The quality of sleep is good.   Her past medical history is significant for anxiety/panic attacks. There is no history of depression. Past treatments include SSRIs and benzodiazephines. Compliance with prior treatments has been good.  Sinusitis This is a new problem. The current episode started 1 to 4 weeks ago. The problem is unchanged. There has been no fever. Her pain is at a severity of 8/10. The pain is moderate. Associated symptoms include congestion, coughing, ear pain, a hoarse voice, sinus pressure, sneezing and a sore throat. Pertinent negatives include no headaches or shortness of breath. Past treatments include lying down. The treatment provided mild relief.  COPD Pt currently taking advair daily and albuterol "about 3 times a week". Pt educated on only using the albuterol as needed. Pt states she has been using it more lately though because she has been sick and hot weather.     Review of Systems  Constitutional: Negative.  Negative for irritability.  HENT: Positive for congestion, ear pain, hoarse voice, sinus pressure, sneezing and sore throat.   Eyes: Negative.   Respiratory: Positive for cough. Negative for shortness of breath.   Cardiovascular: Negative.  Negative for palpitations.  Gastrointestinal: Negative.   Endocrine: Negative.   Genitourinary: Negative.   Musculoskeletal: Negative.   Neurological: Negative.  Negative for headaches.  Hematological: Negative.   Psychiatric/Behavioral: Negative.  The patient is not nervous/anxious and does not have insomnia.   All other systems reviewed and are negative.      Objective:     Physical Exam  Constitutional: She is oriented to person, place, and time. She appears well-developed and well-nourished. No distress.  HENT:  Head: Normocephalic and atraumatic.  Right Ear: External ear normal.  Left Ear: External ear normal.  Nose: Nose normal.  Mouth/Throat: Oropharynx is clear and moist.  Eyes: Pupils are equal, round, and reactive to light.  Neck: Normal range of motion. Neck supple. No thyromegaly present.  Cardiovascular: Normal rate, regular rhythm, normal heart sounds and intact distal pulses.   No murmur heard. Pulmonary/Chest: Effort normal and breath sounds normal. No respiratory distress. She has no wheezes.  Abdominal: Soft. Bowel sounds are normal. She exhibits no distension. There is no tenderness.  Musculoskeletal: Normal range of motion. She exhibits no edema or tenderness.  Neurological: She is alert and oriented to person, place, and time. She has normal reflexes. No cranial nerve deficit.  Skin: Skin is warm and dry.  Psychiatric: She has a normal mood and affect. Her behavior is normal. Judgment and thought content normal.  Vitals reviewed.   BP 122/71 mmHg  Pulse 72  Temp(Src) 97 F (36.1 C) (Oral)  Ht 5' 4.5" (1.638 m)  Wt 142 lb (64.411 kg)  BMI 24.01 kg/m2       Assessment & Plan:  1. Chronic obstructive pulmonary disease, unspecified COPD, unspecified chronic bronchitis type - CMP14+EGFR  2. Osteoporosis - CMP14+EGFR  3. GAD (generalized anxiety disorder) - CMP14+EGFR - LORazepam (ATIVAN) 0.5 MG tablet; Take 1 tablet (0.5 mg total) by mouth every 8 (eight) hours as needed. for anxiety  Dispense: 60 tablet; Refill: 0  4. Vitamin D deficiency - CMP14+EGFR - Vit D  25 hydroxy (rtn osteoporosis monitoring)  5. Allergic rhinitis, unspecified allergic rhinitis type - CMP14+EGFR - montelukast (SINGULAIR) 10 MG tablet; Take 1 tablet (10 mg total) by mouth at bedtime.  Dispense: 30 tablet; Refill: 3    Continue all meds Labs  pending Health Maintenance reviewed Diet and exercise encouraged RTO 6 months  Evelina Dun, FNP

## 2014-10-02 ENCOUNTER — Encounter: Payer: Self-pay | Admitting: *Deleted

## 2014-10-02 ENCOUNTER — Other Ambulatory Visit: Payer: Self-pay | Admitting: *Deleted

## 2014-10-02 DIAGNOSIS — H8110 Benign paroxysmal vertigo, unspecified ear: Secondary | ICD-10-CM

## 2014-10-02 LAB — VITAMIN D 25 HYDROXY (VIT D DEFICIENCY, FRACTURES): Vit D, 25-Hydroxy: 27.2 ng/mL — ABNORMAL LOW (ref 30.0–100.0)

## 2014-10-02 LAB — CMP14+EGFR
ALK PHOS: 143 IU/L — AB (ref 39–117)
ALT: 15 IU/L (ref 0–32)
AST: 15 IU/L (ref 0–40)
Albumin/Globulin Ratio: 2.2 (ref 1.1–2.5)
Albumin: 4.1 g/dL (ref 3.6–4.8)
BUN/Creatinine Ratio: 14 (ref 11–26)
BUN: 11 mg/dL (ref 8–27)
Bilirubin Total: 0.2 mg/dL (ref 0.0–1.2)
CO2: 24 mmol/L (ref 18–29)
CREATININE: 0.8 mg/dL (ref 0.57–1.00)
Calcium: 9.6 mg/dL (ref 8.7–10.3)
Chloride: 97 mmol/L (ref 97–108)
GFR calc Af Amer: 89 mL/min/{1.73_m2} (ref >59)
GFR, EST NON AFRICAN AMERICAN: 77 mL/min/{1.73_m2} (ref >59)
GLUCOSE: 92 mg/dL (ref 65–99)
Globulin, Total: 1.9 g/dL (ref 1.5–4.5)
Potassium: 4.5 mmol/L (ref 3.5–5.2)
Sodium: 136 mmol/L (ref 134–144)
Total Protein: 6 g/dL (ref 6.0–8.5)

## 2014-10-02 MED ORDER — MECLIZINE HCL 25 MG PO TABS: 25.0000 mg | ORAL_TABLET | Freq: Three times a day (TID) | ORAL | Status: DC | PRN

## 2014-10-28 ENCOUNTER — Other Ambulatory Visit: Payer: Self-pay | Admitting: Family

## 2014-10-28 NOTE — Telephone Encounter (Signed)
Last filled 09/25/14, last seen 10/01/14. Route to pool, [facility] call in at [facility]

## 2014-10-29 NOTE — Telephone Encounter (Signed)
Refill called to pharmacy VM 

## 2014-11-20 ENCOUNTER — Encounter: Payer: Self-pay | Admitting: Pediatrics

## 2014-11-20 ENCOUNTER — Ambulatory Visit (INDEPENDENT_AMBULATORY_CARE_PROVIDER_SITE_OTHER): Payer: Medicare Other | Admitting: Pediatrics

## 2014-11-20 ENCOUNTER — Other Ambulatory Visit: Payer: Self-pay | Admitting: Family

## 2014-11-20 VITALS — BP 113/69 | HR 81 | Temp 97.2°F | Ht 64.5 in | Wt 141.6 lb

## 2014-11-20 DIAGNOSIS — J441 Chronic obstructive pulmonary disease with (acute) exacerbation: Secondary | ICD-10-CM

## 2014-11-20 MED ORDER — DOXYCYCLINE HYCLATE 100 MG PO TABS: 100.0000 mg | ORAL_TABLET | Freq: Two times a day (BID) | ORAL | Status: AC

## 2014-11-20 NOTE — Progress Notes (Signed)
Subjective:    Patient ID: Lindsey May, female    DOB: May 06, 1950, 64 y.o.   MRN: [id number]  HPI: Lindsey May is a 64 y.o. female presenting on 11/20/2014 for Fever; sinus pressure; and Headache  Eyes are running, nose is running, back hurts, coughs more at night over past few days. Increased sputum production, more breathlessness. Takes advair every day. Uses breathing machine 2-3 times a day. Not needing any more since she has been sick. Coughing more up with the breathing machine. Tylenol only, not taking NSAIDs.  No sick contacts. Divorced since 40. Says she is not smoking.  With azithromycin makes her feel wired, no swelling or rashes.   Relevant past medical, surgical, family and social history reviewed and updated as indicated. Interim medical history since our last visit reviewed. Allergies and medications reviewed and updated.   ROS: Per HPI unless specifically indicated above  Past Medical History Patient Active Problem List   Diagnosis Date Noted  . Osteoporosis 07/01/2014  . Vitamin D deficiency 04/02/2014  . GAD (generalized anxiety disorder) 04/01/2014  . COPD (chronic obstructive pulmonary disease) 04/01/2014    Current Outpatient Prescriptions  Medication Sig Dispense Refill  . albuterol (PROVENTIL HFA;VENTOLIN HFA) 108 (90 BASE) MCG/ACT inhaler Inhale 2 puffs into the lungs every 6 (six) hours as needed for wheezing or shortness of breath. 1 Inhaler 5  . albuterol (PROVENTIL) (2.5 MG/3ML) 0.083% nebulizer solution USE ONE VIAL IN NEBULIZER EVERY 6 HOURS AS NEEDED FOR WHEEZING OR SHORTNESS OF BREATH 150 mL 0  . aspirin EC 81 MG tablet Take 1 tablet (81 mg total) by mouth daily.    . Cholecalciferol (VITAMIN D PO) Take by mouth.    . fluticasone (FLONASE) 50 MCG/ACT nasal spray Place 2 sprays into both nostrils daily. 16 g 6  . Fluticasone-Salmeterol (ADVAIR DISKUS) 500-50 MCG/DOSE AEPB Inhale 1 puff into the lungs 2 (two) times daily. 1 each 11  . LORazepam  (ATIVAN) 0.5 MG tablet TAKE ONE TABLET BY MOUTH EVERY 8 HOURS AS NEEDED FOR ANXIETY 60 tablet 0  . meclizine (ANTIVERT) 25 MG tablet Take 1 tablet (25 mg total) by mouth 3 (three) times daily as needed for dizziness. 30 tablet 2  . montelukast (SINGULAIR) 10 MG tablet Take 1 tablet (10 mg total) by mouth at bedtime. 30 tablet 3  . Multiple Vitamins-Iron (MULTI-VITAMIN/IRON PO) Take 1 tablet by mouth daily.    Marland Kitchen nystatin cream (MYCOSTATIN) Apply 1 application topically at bedtime.    Marland Kitchen trimethoprim-polymyxin b (POLYTRIM) ophthalmic solution Place 1 drop into both eyes every 4 (four) hours. Prn as needed 10 mL 0  . doxycycline (VIBRA-TABS) 100 MG tablet Take 1 tablet (100 mg total) by mouth 2 (two) times daily. 1 po bid 10 tablet 0   No current facility-administered medications for this visit.       Objective:    BP 113/69 mmHg  Pulse 81  Temp(Src) 97.2 F (36.2 C) (Oral)  Ht 5' 4.5" (1.638 m)  Wt 141 lb 9.6 oz (64.229 kg)  BMI 23.94 kg/m2  SpO2 99%  Wt Readings from Last 3 Encounters:  11/20/14 141 lb 9.6 oz (64.229 kg)  10/01/14 142 lb (64.411 kg)  09/11/14 142 lb (64.411 kg)    Gen: NAD, alert, cooperative with exam, NCAT EYES: EOMI, no scleral injection or icterus ENT:  TMs pearly gray b/l, OP without erythema LYMPH: no cervical LAD CV: NRRR, normal S1/S2, no murmur Resp: CTABL, no wheezes, normal WOB, prolonged expiratory  phase Ext: No edema, warm. Varicose veins present b/l LE.  Neuro:  Alert and oriented     Assessment & Plan:    Crysta was seen today for URI symptoms, also with h/o COPD, increased sputum production, breathlessness. Will treat with doxycyline for exacerbation. Symptomatic care for URI.   Diagnoses and all orders for this visit:  COPD exacerbation -     doxycycline (VIBRA-TABS) 100 MG tablet; Take 1 tablet (100 mg total) by mouth 2 (two) times daily. 1 po bid   Follow up plan: Return in about 3 weeks (around 12/11/2014) for fluid discussion, pt  thinks fluid comes and goes from legs. None today.  Rex Kras, MD [facility] 11/20/2014, 2:21 PM

## 2014-11-21 NOTE — Telephone Encounter (Signed)
Ok to refill, needs to see christy before next refill. Arville Care, MD [facility] 11/21/2014, 9:16 AM

## 2014-11-21 NOTE — Telephone Encounter (Signed)
Refill called to [facility] 

## 2014-11-21 NOTE — Telephone Encounter (Signed)
2wk rck resched w/ Neysa Bonito

## 2014-11-21 NOTE — Telephone Encounter (Signed)
Last seen 10/01/14 Lindsey May  If approved route to nurse to call into [facility]

## 2014-12-12 ENCOUNTER — Ambulatory Visit: Payer: Medicare Other | Admitting: Pediatrics

## 2014-12-12 ENCOUNTER — Ambulatory Visit: Payer: Medicare Other | Admitting: Family

## 2014-12-13 ENCOUNTER — Ambulatory Visit: Payer: Medicare Other | Admitting: Family

## 2014-12-16 ENCOUNTER — Ambulatory Visit (INDEPENDENT_AMBULATORY_CARE_PROVIDER_SITE_OTHER): Payer: Medicare Other | Admitting: Family

## 2014-12-16 ENCOUNTER — Telehealth: Payer: Self-pay | Admitting: Family

## 2014-12-16 ENCOUNTER — Encounter: Payer: Self-pay | Admitting: Family

## 2014-12-16 VITALS — BP 125/70 | HR 81 | Temp 97.2°F | Ht 64.5 in | Wt 141.0 lb

## 2014-12-16 DIAGNOSIS — J449 Chronic obstructive pulmonary disease, unspecified: Secondary | ICD-10-CM

## 2014-12-16 DIAGNOSIS — J309 Allergic rhinitis, unspecified: Secondary | ICD-10-CM | POA: Insufficient documentation

## 2014-12-16 DIAGNOSIS — J320 Chronic maxillary sinusitis: Secondary | ICD-10-CM | POA: Diagnosis not present

## 2014-12-16 DIAGNOSIS — H8110 Benign paroxysmal vertigo, unspecified ear: Secondary | ICD-10-CM | POA: Diagnosis not present

## 2014-12-16 MED ORDER — MECLIZINE HCL 25 MG PO TABS: 25.0000 mg | ORAL_TABLET | Freq: Three times a day (TID) | ORAL | Status: DC | PRN

## 2014-12-16 MED ORDER — LORAZEPAM 0.5 MG PO TABS: 0.5000 mg | ORAL_TABLET | Freq: Three times a day (TID) | ORAL | Status: DC | PRN

## 2014-12-16 MED ORDER — POLYMYXIN B-TRIMETHOPRIM 10000-0.1 UNIT/ML-% OP SOLN: 1.0000 [drp] | OPHTHALMIC | Status: DC

## 2014-12-16 MED ORDER — NYSTATIN 100000 UNIT/GM EX CREA: 1.0000 "application " | TOPICAL_CREAM | Freq: Every day | CUTANEOUS | Status: DC

## 2014-12-16 NOTE — Patient Instructions (Signed)
Chronic Obstructive Pulmonary Disease Chronic obstructive pulmonary disease (COPD) is a common lung condition in which airflow from the lungs is limited. COPD is a general term that can be used to describe many different lung problems that limit airflow, including both chronic bronchitis and emphysema. If you have COPD, your lung function will probably never return to normal, but there are measures you can take to improve lung function and make yourself feel better. CAUSES   Smoking (common).  Exposure to secondhand smoke.  Genetic problems.  Chronic inflammatory lung diseases or recurrent infections. SYMPTOMS  Shortness of breath, especially with physical activity.  Deep, persistent (chronic) cough with a large amount of thick mucus.  Wheezing.  Rapid breaths (tachypnea).  Gray or bluish discoloration (cyanosis) of the skin, especially in your fingers, toes, or lips.  Fatigue.  Weight loss.  Frequent infections or episodes when breathing symptoms become much worse (exacerbations).  Chest tightness. DIAGNOSIS Your health care provider will take a medical history and perform a physical examination to diagnose COPD. Additional tests for COPD may include:  Lung (pulmonary) function tests.  Chest X-ray.  CT scan.  Blood tests. TREATMENT  Treatment for COPD may include:  Inhaler and nebulizer medicines. These help manage the symptoms of COPD and make your breathing more comfortable.  Supplemental oxygen. Supplemental oxygen is only helpful if you have a low oxygen level in your blood.  Exercise and physical activity. These are beneficial for nearly all people with COPD.  Lung surgery or transplant.  Nutrition therapy to gain weight, if you are underweight.  Pulmonary rehabilitation. This may involve working with a team of health care providers and specialists, such as respiratory, occupational, and physical therapists. HOME CARE INSTRUCTIONS  Take all medicines  (inhaled or pills) as directed by your health care provider.  Avoid over-the-counter medicines or cough syrups that dry up your airway (such as antihistamines) and slow down the elimination of secretions unless instructed otherwise by your health care provider.  If you are a smoker, the most important thing that you can do is stop smoking. Continuing to smoke will cause further lung damage and breathing trouble. Ask your health care provider for help with quitting smoking. He or she can direct you to community resources or hospitals that provide support.  Avoid exposure to irritants such as smoke, chemicals, and fumes that aggravate your breathing.  Use oxygen therapy and pulmonary rehabilitation if directed by your health care provider. If you require home oxygen therapy, ask your health care provider whether you should purchase a pulse oximeter to measure your oxygen level at home.  Avoid contact with individuals who have a contagious illness.  Avoid extreme temperature and humidity changes.  Eat healthy foods. Eating smaller, more frequent meals and resting before meals may help you maintain your strength.  Stay active, but balance activity with periods of rest. Exercise and physical activity will help you maintain your ability to do things you want to do.  Preventing infection and hospitalization is very important when you have COPD. Make sure to receive all the vaccines your health care provider recommends, especially the pneumococcal and influenza vaccines. Ask your health care provider whether you need a pneumonia vaccine.  Learn and use relaxation techniques to manage stress.  Learn and use controlled breathing techniques as directed by your health care provider. Controlled breathing techniques include:  Pursed lip breathing. Start by breathing in (inhaling) through your nose for 1 second. Then, purse your lips as if you were   going to whistle and breathe out (exhale) through the  pursed lips for 2 seconds.  Diaphragmatic breathing. Start by putting one hand on your abdomen just above your waist. Inhale slowly through your nose. The hand on your abdomen should move out. Then purse your lips and exhale slowly. You should be able to feel the hand on your abdomen moving in as you exhale.  Learn and use controlled coughing to clear mucus from your lungs. Controlled coughing is a series of short, progressive coughs. The steps of controlled coughing are: 1. Lean your head slightly forward. 2. Breathe in deeply using diaphragmatic breathing. 3. Try to hold your breath for 3 seconds. 4. Keep your mouth slightly open while coughing twice. 5. Spit any mucus out into a tissue. 6. Rest and repeat the steps once or twice as needed. SEEK MEDICAL CARE IF:  You are coughing up more mucus than usual.  There is a change in the color or thickness of your mucus.  Your breathing is more labored than usual.  Your breathing is faster than usual. SEEK IMMEDIATE MEDICAL CARE IF:  You have shortness of breath while you are resting.  You have shortness of breath that prevents you from:  Being able to talk.  Performing your usual physical activities.  You have chest pain lasting longer than 5 minutes.  Your skin color is more cyanotic than usual.  You measure low oxygen saturations for longer than 5 minutes with a pulse oximeter. MAKE SURE YOU:  Understand these instructions.  Will watch your condition.  Will get help right away if you are not doing well or get worse.   This information is not intended to replace advice given to you by your health care provider. Make sure you discuss any questions you have with your health care provider.   Document Released: 11/25/2004 Document Revised: 03/08/2014 Document Reviewed: 10/12/2012 Elsevier Interactive Patient Education 2016 [employer] Sinusitis, Adult Sinusitis is redness, soreness, and inflammation of the paranasal  sinuses. Paranasal sinuses are air pockets within the bones of your face. They are located beneath your eyes, in the middle of your forehead, and above your eyes. In healthy paranasal sinuses, mucus is able to drain out, and air is able to circulate through them by way of your nose. However, when your paranasal sinuses are inflamed, mucus and air can become trapped. This can allow bacteria and other germs to grow and cause infection. Sinusitis can develop quickly and last only a short time (acute) or continue over a long period (chronic). Sinusitis that lasts for more than 12 weeks is considered chronic. CAUSES Causes of sinusitis include:  Allergies.  Structural abnormalities, such as displacement of the cartilage that separates your nostrils (deviated septum), which can decrease the air flow through your nose and sinuses and affect sinus drainage.  Functional abnormalities, such as when the small hairs (cilia) that line your sinuses and help remove mucus do not work properly or are not present. SIGNS AND SYMPTOMS Symptoms of acute and chronic sinusitis are the same. The primary symptoms are pain and pressure around the affected sinuses. Other symptoms include:  Upper toothache.  Earache.  Headache.  Bad breath.  Decreased sense of smell and taste.  A cough, which worsens when you are lying flat.  Fatigue.  Fever.  Thick drainage from your nose, which often is green and may contain pus (purulent).  Swelling and warmth over the affected sinuses. DIAGNOSIS Your health care provider will perform a physical exam.  During your exam, your health care provider may perform any of the following to help determine if you have acute sinusitis or chronic sinusitis:  Look in your nose for signs of abnormal growths in your nostrils (nasal polyps).  Tap over the affected sinus to check for signs of infection.  View the inside of your sinuses using an imaging device that has a light attached  (endoscope). If your health care provider suspects that you have chronic sinusitis, one or more of the following tests may be recommended:  Allergy tests.  Nasal culture. A sample of mucus is taken from your nose, sent to a lab, and screened for bacteria.  Nasal cytology. A sample of mucus is taken from your nose and examined by your health care provider to determine if your sinusitis is related to an allergy. TREATMENT Most cases of acute sinusitis are related to a viral infection and will resolve on their own within 10 days. Sometimes, medicines are prescribed to help relieve symptoms of both acute and chronic sinusitis. These may include pain medicines, decongestants, nasal steroid sprays, or saline sprays. However, for sinusitis related to a bacterial infection, your health care provider will prescribe antibiotic medicines. These are medicines that will help kill the bacteria causing the infection. Rarely, sinusitis is caused by a fungal infection. In these cases, your health care provider will prescribe antifungal medicine. For some cases of chronic sinusitis, surgery is needed. Generally, these are cases in which sinusitis recurs more than 3 times per year, despite other treatments. HOME CARE INSTRUCTIONS  Drink plenty of water. Water helps thin the mucus so your sinuses can drain more easily.  Use a humidifier.  Inhale steam 3-4 times a day (for example, sit in the bathroom with the shower running).  Apply a warm, moist washcloth to your face 3-4 times a day, or as directed by your health care provider.  Use saline nasal sprays to help moisten and clean your sinuses.  Take medicines only as directed by your health care provider.  If you were prescribed either an antibiotic or antifungal medicine, finish it all even if you start to feel better. SEEK IMMEDIATE MEDICAL CARE IF: 7. You have increasing pain or severe headaches. 8. You have nausea, vomiting, or drowsiness. 9. You have  swelling around your face. 10. You have vision problems. 11. You have a stiff neck. 12. You have difficulty breathing.   This information is not intended to replace advice given to you by your health care provider. Make sure you discuss any questions you have with your health care provider.   Document Released: 02/15/2005 Document Revised: 03/08/2014 Document Reviewed: 03/02/2011 Elsevier Interactive Patient Education [employer]

## 2014-12-16 NOTE — Progress Notes (Addendum)
   Subjective:    Patient ID: Lindsey May, female    DOB: Jun 29, 1950, 64 y.o.   MRN: [id number]  Pt presents to the office to recheck URI and COPD. Pt states her SOB is improved, but can not talk because she is hoarse. Pt states she continue to have an intermittent non-producitve cough, myalgia, and sinus pressure.  Cough This is a recurrent problem. The current episode started more than 1 month ago. The problem has been waxing and waning. The problem occurs every few minutes. The cough is non-productive. Associated symptoms include ear pain, myalgias, postnasal drip, rhinorrhea, a sore throat and wheezing. Pertinent negatives include no chills, ear congestion, fever, headaches or shortness of breath. The symptoms are aggravated by lying down and pollens. She has tried rest and ipratropium inhaler for the symptoms. The treatment provided mild relief. Her past medical history is significant for COPD.      Review of Systems  Constitutional: Negative.  Negative for fever and chills.  HENT: Positive for ear pain, postnasal drip, rhinorrhea and sore throat.   Eyes: Negative.   Respiratory: Positive for cough and wheezing. Negative for shortness of breath.   Cardiovascular: Negative.  Negative for palpitations.  Gastrointestinal: Negative.   Endocrine: Negative.   Genitourinary: Negative.   Musculoskeletal: Positive for myalgias.  Neurological: Negative.  Negative for headaches.  Hematological: Negative.   Psychiatric/Behavioral: Negative.   All other systems reviewed and are negative.      Objective:   Physical Exam  Constitutional: She is oriented to person, place, and time. She appears well-developed and well-nourished. No distress.  HENT:  Head: Normocephalic and atraumatic.  Right Ear: External ear normal.  Nose: Right sinus exhibits maxillary sinus tenderness and frontal sinus tenderness. Left sinus exhibits maxillary sinus tenderness and frontal sinus tenderness.  Oropharynx  erythemas   Eyes: Pupils are equal, round, and reactive to light.  Neck: Normal range of motion. Neck supple. No thyromegaly present.  Cardiovascular: Normal rate, regular rhythm, normal heart sounds and intact distal pulses.   No murmur heard. Pulmonary/Chest: Effort normal and breath sounds normal. No respiratory distress. She has no wheezes.  Diminished breath sounds bilaterally   Abdominal: Soft. Bowel sounds are normal. She exhibits no distension. There is no tenderness.  Musculoskeletal: Normal range of motion. She exhibits no edema or tenderness.  Neurological: She is alert and oriented to person, place, and time. She has normal reflexes. No cranial nerve deficit.  Skin: Skin is warm and dry.  Psychiatric: She has a normal mood and affect. Her behavior is normal. Judgment and thought content normal.  Vitals reviewed.     BP 125/70 mmHg  Pulse 81  Temp(Src) 97.2 F (36.2 C) (Oral)  Ht 5' 4.5" (1.638 m)  Wt 141 lb (63.957 kg)  BMI 23.84 kg/m2     Assessment & Plan:  1. Chronic obstructive pulmonary disease, unspecified COPD type (HCC) - Ambulatory referral to ENT  2. Allergic rhinitis, unspecified allergic rhinitis type - Ambulatory referral to ENT  3. Chronic maxillary sinusitis  -Continue taking Singulair,  Flonase, and Advair daily - ENT referral pending of chronic sinusitis -Encouraged to use nettie pot as needed - Take meds as prescribed - Use a cool mist humidifier  -Use saline nose sprays frequently -Saline irrigations of the nose can be very helpful if done frequently. -RTO prn and keep chronic follow up  Jannifer Rodneyhristy Hawks, FNP

## 2014-12-16 NOTE — Addendum Note (Signed)
Addended by: Jannifer RodneyHAWKS, Mariyam Remington A on: 12/16/2014 01:19 PM   Modules accepted: Orders

## 2015-01-14 DIAGNOSIS — J329 Chronic sinusitis, unspecified: Secondary | ICD-10-CM | POA: Diagnosis not present

## 2015-01-14 DIAGNOSIS — H9201 Otalgia, right ear: Secondary | ICD-10-CM | POA: Diagnosis not present

## 2015-01-20 ENCOUNTER — Other Ambulatory Visit: Payer: Self-pay | Admitting: Family

## 2015-01-21 NOTE — Telephone Encounter (Signed)
Last seen 12/16/14  Neysa BonitoChristy  If approved route to nurse [facility]

## 2015-01-21 NOTE — Telephone Encounter (Signed)
rx called to pharmacy 

## 2015-03-21 ENCOUNTER — Encounter: Payer: Self-pay | Admitting: Family Medicine

## 2015-03-21 ENCOUNTER — Ambulatory Visit (INDEPENDENT_AMBULATORY_CARE_PROVIDER_SITE_OTHER): Payer: Medicare Other | Admitting: Family Medicine

## 2015-03-21 VITALS — BP 122/76 | HR 79 | Temp 96.6°F | Ht 64.5 in | Wt 136.8 lb

## 2015-03-21 DIAGNOSIS — R509 Fever, unspecified: Secondary | ICD-10-CM | POA: Diagnosis not present

## 2015-03-21 DIAGNOSIS — R6883 Chills (without fever): Secondary | ICD-10-CM | POA: Diagnosis not present

## 2015-03-21 DIAGNOSIS — R52 Pain, unspecified: Secondary | ICD-10-CM | POA: Diagnosis not present

## 2015-03-21 DIAGNOSIS — H8110 Benign paroxysmal vertigo, unspecified ear: Secondary | ICD-10-CM

## 2015-03-21 DIAGNOSIS — J189 Pneumonia, unspecified organism: Secondary | ICD-10-CM | POA: Diagnosis not present

## 2015-03-21 LAB — POCT INFLUENZA A/B
Influenza A, POC: NEGATIVE
Influenza B, POC: NEGATIVE

## 2015-03-21 MED ORDER — CEFDINIR 300 MG PO CAPS: 300.0000 mg | ORAL_CAPSULE | Freq: Two times a day (BID) | ORAL | Status: DC

## 2015-03-21 MED ORDER — MECLIZINE HCL 25 MG PO TABS: 25.0000 mg | ORAL_TABLET | Freq: Three times a day (TID) | ORAL | Status: DC | PRN

## 2015-03-21 MED ORDER — TRAMADOL HCL 50 MG PO TABS: 50.0000 mg | ORAL_TABLET | Freq: Three times a day (TID) | ORAL | Status: DC | PRN

## 2015-03-21 NOTE — Patient Instructions (Addendum)
Great to see you!  I am treating you for pneumonia  Take all of the antibiotics, they are strong so eat yogurt once daily while you are on it.   [place] albuterol every 4 hours as needed for cough  Try tramadol for pain. This is a controlled substance and a short term medicine. Do not drive after taking this.   Community-Acquired Pneumonia, Adult Pneumonia is an infection of the lungs. There are different types of pneumonia. One type can develop while a person is in a hospital. A different type, called community-acquired pneumonia, develops in people who are not, or have not recently been, in the hospital or other health care facility.  CAUSES Pneumonia may be caused by bacteria, viruses, or funguses. Community-acquired pneumonia is often caused by Streptococcus pneumonia bacteria. These bacteria are often passed from one person to another by breathing in droplets from the cough or sneeze of an infected person. RISK FACTORS The condition is more likely to develop in:  People who havechronic diseases, such as chronic obstructive pulmonary disease (COPD), asthma, congestive heart failure, cystic fibrosis, diabetes, or kidney disease.  People who haveearly-stage or late-stage HIV.  People who havesickle cell disease.  People who havehad their spleen removed (splenectomy).  People who havepoor Administrator.  People who havemedical conditions that increase the risk of breathing in (aspirating) secretions their own mouth and nose.   People who havea weakened immune system (immunocompromised).  People who smoke.  People whotravel to areas where pneumonia-causing germs commonly exist.  People whoare around animal habitats or animals that have pneumonia-causing germs, including birds, bats, rabbits, cats, and farm animals. SYMPTOMS Symptoms of this condition include:  Adry cough.  A wet (productive) cough.  Fever.  Sweating.  Chest pain, especially when breathing deeply  or coughing.  Rapid breathing or difficulty breathing.  Shortness of breath.  Shaking chills.  Fatigue.  Muscle aches. DIAGNOSIS Your health care provider will take a medical history and perform a physical exam. You may also have other tests, including:  Imaging studies of your chest, including X-rays.  Tests to check your blood oxygen level and other blood gases.  Other tests on blood, mucus (sputum), fluid around your lungs (pleural fluid), and urine. If your pneumonia is severe, other tests may be done to identify the specific cause of your illness. TREATMENT The type of treatment that you receive depends on many factors, such as the cause of your pneumonia, the medicines you take, and other medical conditions that you have. For most adults, treatment and recovery from pneumonia may occur at home. In some cases, treatment must happen in a hospital. Treatment may include:  Antibiotic medicines, if the pneumonia was caused by bacteria.  Antiviral medicines, if the pneumonia was caused by a virus.  Medicines that are given by mouth or through an IV tube.  Oxygen.  Respiratory therapy. Although rare, treating severe pneumonia may include:  Mechanical ventilation. This is done if you are not breathing well on your own and you cannot maintain a safe blood oxygen level.  Thoracentesis. This procedureremoves fluid around one lung or both lungs to help you breathe better. HOME CARE INSTRUCTIONS  Take over-the-counter and prescription medicines only as told by your health care provider.  Only takecough medicine if you are losing sleep. Understand that cough medicine can prevent your body's natural ability to remove mucus from your lungs.  If you were prescribed an antibiotic medicine, take it as told by your health care provider. Do  not stop taking the antibiotic even if you start to feel better.  Sleep in a semi-upright position at night. Try sleeping in a reclining chair, or  place a few pillows under your head.  Do not use tobacco products, including cigarettes, chewing tobacco, and e-cigarettes. If you need help quitting, ask your health care provider.  Drink enough water to keep your urine clear or pale yellow. This will help to thin out mucus secretions in your lungs. PREVENTION There are ways that you can decrease your risk of developing community-acquired pneumonia. Consider getting a pneumococcal vaccine if:  You are older than 67 years of age.  You are older than 67 years of age and are undergoing cancer treatment, have chronic lung disease, or have other medical conditions that affect your immune system. Ask your health care provider if this applies to you. There are different types and schedules of pneumococcal vaccines. Ask your health care provider which vaccination option is best for you. You may also prevent community-acquired pneumonia if you take these actions:  Get an influenza vaccine every year. Ask your health care provider which type of influenza vaccine is best for you.  Go to the dentist on a regular basis.  Wash your hands often. Use hand sanitizer if soap and water are not available. SEEK MEDICAL CARE IF:  You have a fever.  You are losing sleep because you cannot control your cough with cough medicine. SEEK IMMEDIATE MEDICAL CARE IF:  You have worsening shortness of breath.  You have increased chest pain.  Your sickness becomes worse, especially if you are an older adult or have a weakened immune system.  You cough up blood.   This information is not intended to replace advice given to you by your health care provider. Make sure you discuss any questions you have with your health care provider.   Document Released: 02/15/2005 Document Revised: 11/06/2014 Document Reviewed: 06/12/2014 Elsevier Interactive Patient Education [employer]

## 2015-03-21 NOTE — Progress Notes (Signed)
   HPI  Patient presents today here with fever chills and body aches.  Patient explains that she's had her symptoms for about 3 days. Her fever is subjective, not measured She states that she has fibromyalgia and has made her fibromyalgia intensely worse. Her albuterol treatments are helping. She Has continued her Advair. She has no sick contacts.  She states that she would like something stronger for pain.  PMH: Smoking status noted ROS: Per HPI  Objective: BP 122/76 mmHg  Pulse 79  Temp(Src) 96.6 F (35.9 C) (Oral)  Ht 5' 4.5" (1.638 m)  Wt 136 lb 12.8 oz (62.052 kg)  BMI 23.13 kg/m2 Gen: NAD, alert, cooperative with exam HEENT: NCAT CV: RRR, good S1/S2, no murmur Resp: CTABL, no wheezes, non-labored Abd: Crackles in the left base, otherwise clear, nonlabored Ext: No edema, warm Neuro: Alert and oriented, No gross deficits  Assessment and plan:  # Community aquired pneumonia, COPD exacerbation Rapid flu was negative today Considering increased cough, lung findings, and systemic symptoms will go ahead and treat for pneumonia. Treat with Omnicef  Tramadol for pain, explained short term med, related to codeine but unlikely to cause the same reaction.   Offered prednisone, she declines, no significant wheezing.     Orders Placed This Encounter  Procedures  . POCT Influenza A/B     Murtis Sink, MD [facility] 03/21/2015, 9:46 AM

## 2015-04-04 ENCOUNTER — Ambulatory Visit: Payer: Medicare Other | Admitting: Family

## 2015-04-07 ENCOUNTER — Telehealth: Payer: Self-pay | Admitting: *Deleted

## 2015-04-07 ENCOUNTER — Encounter: Payer: Self-pay | Admitting: Family Medicine

## 2015-04-07 NOTE — Telephone Encounter (Signed)
Patient will schedule her 6 months recheck after she gets well and finishes antibiotics.

## 2015-04-22 ENCOUNTER — Other Ambulatory Visit: Payer: Self-pay | Admitting: *Deleted

## 2015-04-22 DIAGNOSIS — J208 Acute bronchitis due to other specified organisms: Secondary | ICD-10-CM

## 2015-04-22 MED ORDER — ALBUTEROL SULFATE HFA 108 (90 BASE) MCG/ACT IN AERS
2.0000 | INHALATION_SPRAY | Freq: Four times a day (QID) | RESPIRATORY_TRACT | Status: DC | PRN
Start: 2015-04-22 — End: 2015-11-18

## 2015-05-19 ENCOUNTER — Encounter: Payer: Self-pay | Admitting: Family Medicine

## 2015-05-19 ENCOUNTER — Ambulatory Visit (INDEPENDENT_AMBULATORY_CARE_PROVIDER_SITE_OTHER): Payer: Medicare Other | Admitting: Family Medicine

## 2015-05-19 VITALS — BP 126/74 | HR 76 | Temp 96.5°F | Ht 64.5 in | Wt 137.0 lb

## 2015-05-19 DIAGNOSIS — R300 Dysuria: Secondary | ICD-10-CM

## 2015-05-19 DIAGNOSIS — H8110 Benign paroxysmal vertigo, unspecified ear: Secondary | ICD-10-CM

## 2015-05-19 LAB — MICROSCOPIC EXAMINATION
RBC, UA: >30 /hpf — AB (ref 0–?)
RENAL EPITHEL UA: NONE SEEN /HPF

## 2015-05-19 LAB — URINALYSIS, COMPLETE
Bilirubin, UA: NEGATIVE
Glucose, UA: NEGATIVE
NITRITE UA: NEGATIVE
PH UA: 6.5 (ref 5.0–7.5)
Specific Gravity, UA: >1.03 — ABNORMAL HIGH (ref 1.005–1.030)
Urobilinogen, Ur: 1 mg/dL (ref 0.2–1.0)

## 2015-05-19 MED ORDER — LORAZEPAM 0.5 MG PO TABS: 0.5000 mg | ORAL_TABLET | Freq: Three times a day (TID) | ORAL | Status: DC | PRN

## 2015-05-19 MED ORDER — NYSTATIN 100000 UNIT/GM EX CREA: 1.0000 "application " | TOPICAL_CREAM | Freq: Every day | CUTANEOUS | Status: DC

## 2015-05-19 MED ORDER — CIPROFLOXACIN HCL 250 MG PO TABS: 250.0000 mg | ORAL_TABLET | Freq: Two times a day (BID) | ORAL | Status: DC

## 2015-05-19 MED ORDER — MECLIZINE HCL 25 MG PO TABS
25.0000 mg | ORAL_TABLET | Freq: Three times a day (TID) | ORAL | Status: DC | PRN
Start: 2015-05-19 — End: 2015-06-18

## 2015-05-19 MED ORDER — TRAMADOL HCL 50 MG PO TABS: 50.0000 mg | ORAL_TABLET | Freq: Three times a day (TID) | ORAL | Status: DC | PRN

## 2015-05-19 NOTE — Progress Notes (Signed)
Subjective:    Patient ID: Lindsey May, female    DOB: Jan 24, 1951, 65 y.o.   MRN: [id number]  HPI Patient here today for possible kidney infection. Symptoms began 2 days ago she has had no fever or she'll's. She has some difficulty urinating. She does have a past history of UTIs.  Also complains of right shoulder pain. She assumed it was fibromyalgia which she has been exam is more suggestive of possible bursitis/tendinitis.     Patient Active Problem List   Diagnosis Date Noted  . Allergic rhinitis 12/16/2014  . Osteoporosis 07/01/2014  . Vitamin D deficiency 04/02/2014  . GAD (generalized anxiety disorder) 04/01/2014  . COPD (chronic obstructive pulmonary disease) (HCC) 04/01/2014   Outpatient Encounter Prescriptions as of 05/19/2015  Medication Sig  . albuterol (PROVENTIL HFA;VENTOLIN HFA) 108 (90 Base) MCG/ACT inhaler Inhale 2 puffs into the lungs every 6 (six) hours as needed for wheezing or shortness of breath.  Marland Kitchen. albuterol (PROVENTIL) (2.5 MG/3ML) 0.083% nebulizer solution USE ONE VIAL IN NEBULIZER EVERY 6 HOURS AS NEEDED FOR WHEEZING OR SHORTNESS OF BREATH  . aspirin EC 81 MG tablet Take 1 tablet (81 mg total) by mouth daily.  . Cholecalciferol (VITAMIN D PO) Take by mouth.  . fluticasone (FLONASE) 50 MCG/ACT nasal spray Place 2 sprays into both nostrils daily.  . Fluticasone-Salmeterol (ADVAIR DISKUS) 500-50 MCG/DOSE AEPB Inhale 1 puff into the lungs 2 (two) times daily.  Marland Kitchen. LORazepam (ATIVAN) 0.5 MG tablet TAKE 1 TABLET BY MOUTH EVERY 8 HOURS AS NEEDED  . meclizine (ANTIVERT) 25 MG tablet Take 1 tablet (25 mg total) by mouth 3 (three) times daily as needed for dizziness.  . montelukast (SINGULAIR) 10 MG tablet Take 1 tablet (10 mg total) by mouth at bedtime.  . Multiple Vitamins-Iron (MULTI-VITAMIN/IRON PO) Take 1 tablet by mouth daily.  Marland Kitchen. nystatin cream (MYCOSTATIN) Apply 1 application topically at bedtime.  . traMADol (ULTRAM) 50 MG tablet Take 1 tablet (50 mg total) by  mouth every 8 (eight) hours as needed.  . trimethoprim-polymyxin b (POLYTRIM) ophthalmic solution Place 1 drop into both eyes every 4 (four) hours. Prn as needed  . [DISCONTINUED] cefdinir (OMNICEF) 300 MG capsule Take 1 capsule (300 mg total) by mouth 2 (two) times daily. 1 po BID   No facility-administered encounter medications on file as of 05/19/2015.      Review of Systems  Constitutional: Negative.   HENT: Negative.   Eyes: Negative.   Respiratory: Negative.   Cardiovascular: Negative.   Gastrointestinal: Negative.   Endocrine: Negative.   Genitourinary: Positive for dysuria and flank pain.  Musculoskeletal: Negative.   Skin: Negative.   Allergic/Immunologic: Negative.   Neurological: Negative.   Hematological: Negative.   Psychiatric/Behavioral: Negative.        Objective:   Physical Exam  Constitutional: She appears well-developed and well-nourished.  Abdominal:  There is bilateral CVA tenderness as well as tenderness over the bladder.  Urinalysis shows blood cells white blood cells and bacteria. Will culture.  Musculoskeletal:  Right shoulder: Maximal tenderness is over the deltoid area superiorly under the acromion. I think this May be consistent with some bursitis or tendinitis.  Area was prepped with alcohol and injected with 1 mL of Depo-Medrol and Marcaine. She tolerated this well.   BP 126/74 mmHg  Pulse 76  Temp(Src) 96.5 F (35.8 C) (Oral)  Ht 5' 4.5" (1.638 m)  Wt 137 lb (62.143 kg)  BMI 23.16 kg/m2        Assessment &  Plan:  1. Dysuria Urinalysis consistent with UTI. Begin Cipro 250 twice a day. Drink more fluids. - Urinalysis, Complete - Urine culture  2. Benign paroxysmal positional vertigo, unspecified laterality Meclizine  Controls symptoms - meclizine (ANTIVERT) 25 MG tablet; Take 1 tablet (25 mg total) by mouth 3 (three) times daily as needed for dizziness.  Dispense: 30 tablet; Refill: 1  Frederica Kuster MD

## 2015-05-21 LAB — URINE CULTURE

## 2015-05-24 ENCOUNTER — Other Ambulatory Visit: Payer: Self-pay | Admitting: *Deleted

## 2015-05-29 MED ORDER — ALBUTEROL SULFATE (2.5 MG/3ML) 0.083% IN NEBU: INHALATION_SOLUTION | RESPIRATORY_TRACT | Status: DC

## 2015-05-29 MED ORDER — POLYMYXIN B-TRIMETHOPRIM 10000-0.1 UNIT/ML-% OP SOLN: 1.0000 [drp] | OPHTHALMIC | Status: DC

## 2015-06-18 ENCOUNTER — Encounter: Payer: Self-pay | Admitting: Family Medicine

## 2015-06-18 ENCOUNTER — Ambulatory Visit (INDEPENDENT_AMBULATORY_CARE_PROVIDER_SITE_OTHER): Payer: Medicare Other | Admitting: Family Medicine

## 2015-06-18 ENCOUNTER — Encounter (INDEPENDENT_AMBULATORY_CARE_PROVIDER_SITE_OTHER): Payer: Self-pay

## 2015-06-18 VITALS — BP 106/64 | HR 67 | Temp 97.1°F | Ht 64.5 in | Wt 136.0 lb

## 2015-06-18 DIAGNOSIS — R350 Frequency of micturition: Secondary | ICD-10-CM

## 2015-06-18 DIAGNOSIS — H8110 Benign paroxysmal vertigo, unspecified ear: Secondary | ICD-10-CM

## 2015-06-18 DIAGNOSIS — G2581 Restless legs syndrome: Secondary | ICD-10-CM | POA: Diagnosis not present

## 2015-06-18 LAB — URINALYSIS
BILIRUBIN UA: NEGATIVE
GLUCOSE, UA: NEGATIVE
KETONES UA: NEGATIVE
Nitrite, UA: NEGATIVE
Protein, UA: NEGATIVE
RBC UA: NEGATIVE
Specific Gravity, UA: 1.01 (ref 1.005–1.030)
UUROB: 0.2 mg/dL (ref 0.2–1.0)
pH, UA: 6.5 (ref 5.0–7.5)

## 2015-06-18 MED ORDER — MECLIZINE HCL 25 MG PO TABS: 25.0000 mg | ORAL_TABLET | Freq: Three times a day (TID) | ORAL | Status: DC | PRN

## 2015-06-18 MED ORDER — ROPINIROLE HCL 0.25 MG PO TABS: 0.2500 mg | ORAL_TABLET | Freq: Every day | ORAL | Status: DC

## 2015-06-18 MED ORDER — LORAZEPAM 0.5 MG PO TABS: 0.5000 mg | ORAL_TABLET | Freq: Three times a day (TID) | ORAL | Status: DC | PRN

## 2015-06-18 NOTE — Progress Notes (Signed)
   Subjective:    Patient ID: Lindsey May, female    DOB: 13-Feb-1951, 65 y.o.   MRN: [id number]  HPI 65 year old female who has several concerns today. She still has pain in her shoulder but the shot that we did recently helped and she would like to get that again if possible. She also had a UTI and although her symptoms now have improved and she would like to be sure that has cleared up. She also has what sounds like restless leg syndrome Also complains of allergy symptoms such as runny nose and sneezing and itching in her eyes.  Patient Active Problem List   Diagnosis Date Noted  . Allergic rhinitis 12/16/2014  . Osteoporosis 07/01/2014  . Vitamin D deficiency 04/02/2014  . GAD (generalized anxiety disorder) 04/01/2014  . COPD (chronic obstructive pulmonary disease) (HCC) 04/01/2014   Outpatient Encounter Prescriptions as of 06/18/2015  Medication Sig  . albuterol (PROVENTIL HFA;VENTOLIN HFA) 108 (90 Base) MCG/ACT inhaler Inhale 2 puffs into the lungs every 6 (six) hours as needed for wheezing or shortness of breath.  Marland Kitchen. albuterol (PROVENTIL) (2.5 MG/3ML) 0.083% nebulizer solution USE ONE VIAL IN NEBULIZER EVERY 6 HOURS AS NEEDED FOR WHEEZING OR SHORTNESS OF BREATH  . aspirin EC 81 MG tablet Take 1 tablet (81 mg total) by mouth daily.  . Cholecalciferol (VITAMIN D PO) Take by mouth.  . ciprofloxacin (CIPRO) 250 MG tablet Take 1 tablet (250 mg total) by mouth 2 (two) times daily.  . fluticasone (FLONASE) 50 MCG/ACT nasal spray Place 2 sprays into both nostrils daily.  . Fluticasone-Salmeterol (ADVAIR DISKUS) 500-50 MCG/DOSE AEPB Inhale 1 puff into the lungs 2 (two) times daily.  Marland Kitchen. LORazepam (ATIVAN) 0.5 MG tablet Take 1 tablet (0.5 mg total) by mouth every 8 (eight) hours as needed.  . meclizine (ANTIVERT) 25 MG tablet Take 1 tablet (25 mg total) by mouth 3 (three) times daily as needed for dizziness.  . montelukast (SINGULAIR) 10 MG tablet Take 1 tablet (10 mg total) by mouth at bedtime.    . Multiple Vitamins-Iron (MULTI-VITAMIN/IRON PO) Take 1 tablet by mouth daily.  Marland Kitchen. nystatin cream (MYCOSTATIN) Apply 1 application topically at bedtime.  . traMADol (ULTRAM) 50 MG tablet Take 1 tablet (50 mg total) by mouth every 8 (eight) hours as needed.  . trimethoprim-polymyxin b (POLYTRIM) ophthalmic solution Place 1 drop into both eyes every 4 (four) hours. Prn as needed   No facility-administered encounter medications on file as of 06/18/2015.      Review of Systems  Constitutional: Negative.   HENT: Positive for rhinorrhea and sneezing.   Respiratory: Negative.   Cardiovascular: Negative.   Neurological: Positive for dizziness.  Psychiatric/Behavioral: Negative.        Objective:   Physical Exam  Constitutional: She is oriented to person, place, and time. She appears well-developed and well-nourished.  Cardiovascular: Normal rate and regular rhythm.   Pulmonary/Chest: Effort normal and breath sounds normal.  Neurological: She is alert and oriented to person, place, and time.          Assessment & Plan:  1. Urinary frequency Urinalysis is basically within normal limits. No treatment indicated - Urinalysis  2. Restless leg syndrome Trial of Requip 0.25 daily at bedtime May increase to 0.5 as needed  - CBC with Differential/Platelet  Frederica KusterStephen M Miller MD

## 2015-06-19 LAB — CBC WITH DIFFERENTIAL/PLATELET
BASOS: 0 %
Basophils Absolute: 0 10*3/uL (ref 0.0–0.2)
EOS (ABSOLUTE): 0.1 10*3/uL (ref 0.0–0.4)
EOS: 1 %
HEMATOCRIT: 37.4 % (ref 34.0–46.6)
HEMOGLOBIN: 12.7 g/dL (ref 11.1–15.9)
Immature Grans (Abs): 0 10*3/uL (ref 0.0–0.1)
Immature Granulocytes: 0 %
LYMPHS ABS: 1.9 10*3/uL (ref 0.7–3.1)
Lymphs: 19 %
MCH: 31.4 pg (ref 26.6–33.0)
MCHC: 34 g/dL (ref 31.5–35.7)
MCV: 92 fL (ref 79–97)
MONOCYTES: 3 %
MONOS ABS: 0.3 10*3/uL (ref 0.1–0.9)
NEUTROS ABS: 7.7 10*3/uL — AB (ref 1.4–7.0)
Neutrophils: 77 %
Platelets: 298 10*3/uL (ref 150–379)
RBC: 4.05 x10E6/uL (ref 3.77–5.28)
RDW: 13.8 % (ref 12.3–15.4)
WBC: 9.9 10*3/uL (ref 3.4–10.8)

## 2015-06-21 ENCOUNTER — Other Ambulatory Visit: Payer: Self-pay | Admitting: Family Medicine

## 2015-06-23 NOTE — Telephone Encounter (Signed)
Last seen 06/18/15 Dr Hyacinth MeekerMiller   Dr Ermalinda MemosBradshaw  PCP  If approved print

## 2015-06-24 NOTE — Telephone Encounter (Signed)
[facility]

## 2015-07-14 ENCOUNTER — Other Ambulatory Visit: Payer: Self-pay | Admitting: Family Medicine

## 2015-07-17 ENCOUNTER — Other Ambulatory Visit: Payer: Self-pay | Admitting: Family Medicine

## 2015-07-17 NOTE — Telephone Encounter (Signed)
Last given 06/18/15 #60 take 1 PO every 8 hours prn no refills. Pt of Dr.Miller

## 2015-08-18 ENCOUNTER — Other Ambulatory Visit: Payer: Self-pay | Admitting: Family Medicine

## 2015-08-18 NOTE — Telephone Encounter (Signed)
Forward to Dr. Ermalinda MemosBradshaw for tomorrow, patient has been seen Dr. Ermalinda MemosBradshaw

## 2015-08-18 NOTE — Telephone Encounter (Signed)
Last seen 06/18/15 Dr Hyacinth MeekerMiller  Dr Ermalinda MemosBradshaw PCP   If approved route to nurse [facility]

## 2015-08-19 NOTE — Telephone Encounter (Signed)
[facility] and [facility] notified

## 2015-08-27 ENCOUNTER — Encounter: Payer: Self-pay | Admitting: Family Medicine

## 2015-08-27 ENCOUNTER — Ambulatory Visit (INDEPENDENT_AMBULATORY_CARE_PROVIDER_SITE_OTHER): Payer: Medicare Other | Admitting: Family Medicine

## 2015-08-27 VITALS — BP 127/70 | HR 77 | Temp 97.0°F | Ht 64.5 in | Wt 133.0 lb

## 2015-08-27 DIAGNOSIS — R300 Dysuria: Secondary | ICD-10-CM

## 2015-08-27 DIAGNOSIS — M25512 Pain in left shoulder: Secondary | ICD-10-CM

## 2015-08-27 LAB — MICROSCOPIC EXAMINATION

## 2015-08-27 LAB — URINALYSIS, COMPLETE
Bilirubin, UA: NEGATIVE
GLUCOSE, UA: NEGATIVE
Ketones, UA: NEGATIVE
NITRITE UA: NEGATIVE
PH UA: 7 (ref 5.0–7.5)
Specific Gravity, UA: 1.015 (ref 1.005–1.030)
Urobilinogen, Ur: 0.2 mg/dL (ref 0.2–1.0)

## 2015-08-27 NOTE — Progress Notes (Signed)
Subjective:    Patient ID: Lindsey May, female    DOB: 25-Jan-1951, 65 y.o.   MRN: [id number]  HPI she has several complaints and needs today. She is requesting another injection in her right shoulder. This has helped previously for a time. She also complains of some back pain and dysuria has been present for 2-3 days. She also complains of some sinus congestion and coughing up yellow sputum.  Patient Active Problem List   Diagnosis Date Noted  . Allergic rhinitis 12/16/2014  . Osteoporosis 07/01/2014  . Vitamin D deficiency 04/02/2014  . GAD (generalized anxiety disorder) 04/01/2014  . COPD (chronic obstructive pulmonary disease) (HCC) 04/01/2014   Outpatient Encounter Prescriptions as of 08/27/2015  Medication Sig  . albuterol (PROVENTIL HFA;VENTOLIN HFA) 108 (90 Base) MCG/ACT inhaler Inhale 2 puffs into the lungs every 6 (six) hours as needed for wheezing or shortness of breath.  Marland Kitchen. albuterol (PROVENTIL) (2.5 MG/3ML) 0.083% nebulizer solution USE ONE VIAL IN NEBULIZER EVERY 6 HOURS AS NEEDED FOR WHEEZING OR SHORTNESS OF BREATH  . aspirin EC 81 MG tablet Take 1 tablet (81 mg total) by mouth daily.  . Cholecalciferol (VITAMIN D PO) Take by mouth.  . ciprofloxacin (CIPRO) 250 MG tablet Take 1 tablet (250 mg total) by mouth 2 (two) times daily.  . fluticasone (FLONASE) 50 MCG/ACT nasal spray USE 2 SPRAYS IN EACH NOSTRIL DAILY  . Fluticasone-Salmeterol (ADVAIR DISKUS) 500-50 MCG/DOSE AEPB Inhale 1 puff into the lungs 2 (two) times daily.  Marland Kitchen. LORazepam (ATIVAN) 0.5 MG tablet TAKE 1 TABLET BY MOUTH EVERY 8 HOURS AS NEEDED  . meclizine (ANTIVERT) 25 MG tablet TAKE 1 TABLET (25 MG TOTAL) BY MOUTH 3 (THREE) TIMES DAILY AS NEEDED FOR DIZZINESS.  . montelukast (SINGULAIR) 10 MG tablet Take 1 tablet (10 mg total) by mouth at bedtime.  . Multiple Vitamins-Iron (MULTI-VITAMIN/IRON PO) Take 1 tablet by mouth daily.  Marland Kitchen. nystatin cream (MYCOSTATIN) Apply 1 application topically at bedtime.  Marland Kitchen. rOPINIRole  (REQUIP) 0.25 MG tablet TAKE 1 TABLET AT BEDTIME THEN CAN GO TO 2 TABLETS AT BEDTIME IF ONE TAB DOESNT HELP  . traMADol (ULTRAM) 50 MG tablet TAKE 1 TABLET BY MOUTH EVERY 8 HOURS AS NEEDED  . trimethoprim-polymyxin b (POLYTRIM) ophthalmic solution PLACE 1 DROP INTO BOTH EYES EVERY 4 (FOUR) HOURS AS NEEDED   No facility-administered encounter medications on file as of 08/27/2015.      Review of Systems  Constitutional: Negative.   HENT: Positive for congestion.   Respiratory: Positive for cough.   Cardiovascular: Negative.   Musculoskeletal: Positive for back pain.       Objective:   Physical Exam  Constitutional: She is oriented to person, place, and time. She appears well-developed and well-nourished.  HENT:  Mouth/Throat: Oropharynx is clear and moist.  Maxillary sinuses are tender  Cardiovascular: Normal rate and regular rhythm.   Pulmonary/Chest: Effort normal and breath sounds normal.  Musculoskeletal:  Right shoulder was injected with Depo-Medrol and Marcaine in the area of the deltoid tendon.  Neurological: She is alert and oriented to person, place, and time.   BP 127/70 mmHg  Pulse 77  Temp(Src) 97 F (36.1 C) (Oral)  Ht 5' 4.5" (1.638 m)  Wt 133 lb (60.328 kg)  BMI 22.48 kg/m2       Assessment & Plan:  1. Dysuria Urine has lots of white cells and bacteria and blood. Will culture begin Septra DS which hopefully will also help possible sinus infection - Urinalysis, Complete  2. Left shoulder pain Probable tendinitis/bursitis injected as described above  Frederica KusterStephen M Miller MD

## 2015-08-28 ENCOUNTER — Telehealth: Payer: Self-pay | Admitting: Family Medicine

## 2015-08-28 MED ORDER — SULFAMETHOXAZOLE-TRIMETHOPRIM 800-160 MG PO TABS: 1.0000 | ORAL_TABLET | Freq: Two times a day (BID) | ORAL | Status: DC

## 2015-08-28 NOTE — Telephone Encounter (Signed)
Rx sent to the pharmacy per office visit notes and pt is aware.

## 2015-09-15 ENCOUNTER — Other Ambulatory Visit: Payer: Self-pay | Admitting: Family Medicine

## 2015-09-16 NOTE — Telephone Encounter (Signed)
Per Berton LanJamie Bullins, Dr. Hyacinth MeekerMiller will let us call in once if legit, Tramadol 50 mg. #30 with no refills, [facility].

## 2015-09-16 NOTE — Telephone Encounter (Signed)
Please forward this request to the provider that prescribed the medication

## 2015-09-19 ENCOUNTER — Ambulatory Visit (INDEPENDENT_AMBULATORY_CARE_PROVIDER_SITE_OTHER): Payer: Medicare Other | Admitting: Family Medicine

## 2015-09-19 ENCOUNTER — Telehealth: Payer: Self-pay | Admitting: *Deleted

## 2015-09-19 ENCOUNTER — Encounter: Payer: Self-pay | Admitting: Family Medicine

## 2015-09-19 ENCOUNTER — Ambulatory Visit (INDEPENDENT_AMBULATORY_CARE_PROVIDER_SITE_OTHER): Payer: Medicare Other

## 2015-09-19 VITALS — BP 97/51 | HR 70 | Temp 97.2°F | Ht 64.5 in | Wt 136.6 lb

## 2015-09-19 DIAGNOSIS — M79645 Pain in left finger(s): Secondary | ICD-10-CM

## 2015-09-19 DIAGNOSIS — N3000 Acute cystitis without hematuria: Secondary | ICD-10-CM

## 2015-09-19 DIAGNOSIS — R300 Dysuria: Secondary | ICD-10-CM | POA: Diagnosis not present

## 2015-09-19 LAB — URINALYSIS, COMPLETE
Bilirubin, UA: NEGATIVE
Glucose, UA: NEGATIVE
KETONES UA: NEGATIVE
NITRITE UA: NEGATIVE
Protein, UA: NEGATIVE
RBC, UA: NEGATIVE
Specific Gravity, UA: 1.01 (ref 1.005–1.030)
Urobilinogen, Ur: 0.2 mg/dL (ref 0.2–1.0)
pH, UA: 7 (ref 5.0–7.5)

## 2015-09-19 LAB — MICROSCOPIC EXAMINATION: RBC MICROSCOPIC, UA: NONE SEEN /HPF (ref 0–?)

## 2015-09-19 MED ORDER — TRAMADOL HCL 50 MG PO TABS: 50.0000 mg | ORAL_TABLET | Freq: Three times a day (TID) | ORAL | Status: DC | PRN

## 2015-09-19 MED ORDER — SULFAMETHOXAZOLE-TRIMETHOPRIM 800-160 MG PO TABS
1.0000 | ORAL_TABLET | Freq: Two times a day (BID) | ORAL | Status: DC
Start: 2015-09-19 — End: 2015-10-15

## 2015-09-19 NOTE — Telephone Encounter (Signed)
-----   Message from Elenora GammaSamuel L Bradshaw, MD sent at 09/19/2015 12:11 PM EDT ----- With the patient know that the x-ray confirms that her thumb is actually dislocated. Continue wearing the brace to see if it provides some comfort, she will be scheduled a sports medicine appointment. Thanks! Sam
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
## 2015-09-19 NOTE — Patient Instructions (Signed)
Great to see you!  I have sent bactrim for your UTI  Wear the brace for comfort  Come back in 2-3 months for follow up  We will call with [place]

## 2015-09-24 LAB — URINE CULTURE

## 2015-10-07 ENCOUNTER — Ambulatory Visit: Payer: Self-pay | Admitting: Sports Medicine

## 2015-10-08 ENCOUNTER — Other Ambulatory Visit: Payer: Self-pay | Admitting: Family Medicine

## 2015-10-08 NOTE — Telephone Encounter (Signed)
Patient last seen in office on 09-15-15. Rx last filled on 09-15-15. Please advise on refills and route to pool A so  nurse can phone in to pharmacy

## 2015-10-08 NOTE — Telephone Encounter (Signed)
Patient last office visit was 09-15-15. Rx last filled on 09-16-15. Please advise and route to pool A so nurse can phone in to pharmacy

## 2015-10-09 NOTE — Telephone Encounter (Signed)
Notified needs appt with tramadol request on same day.   Murtis SinkSam Raquell Richer, MD [facility] 10/09/2015, 7:56 AM

## 2015-10-09 NOTE — Telephone Encounter (Signed)
Pt notified will need to be seen appt scheduled 

## 2015-10-14 ENCOUNTER — Ambulatory Visit: Payer: Medicare Other | Admitting: Family Medicine

## 2015-10-15 ENCOUNTER — Telehealth: Payer: Self-pay | Admitting: Family Medicine

## 2015-10-15 ENCOUNTER — Ambulatory Visit: Payer: Medicare Other | Admitting: Family Medicine

## 2015-10-15 ENCOUNTER — Ambulatory Visit (INDEPENDENT_AMBULATORY_CARE_PROVIDER_SITE_OTHER): Payer: Medicare Other | Admitting: Family Medicine

## 2015-10-15 ENCOUNTER — Encounter: Payer: Self-pay | Admitting: Family Medicine

## 2015-10-15 VITALS — BP 115/69 | HR 70 | Temp 96.7°F | Ht 64.5 in | Wt 140.8 lb

## 2015-10-15 DIAGNOSIS — F411 Generalized anxiety disorder: Secondary | ICD-10-CM

## 2015-10-15 DIAGNOSIS — J3489 Other specified disorders of nose and nasal sinuses: Secondary | ICD-10-CM | POA: Diagnosis not present

## 2015-10-15 DIAGNOSIS — R350 Frequency of micturition: Secondary | ICD-10-CM | POA: Diagnosis not present

## 2015-10-15 DIAGNOSIS — R300 Dysuria: Secondary | ICD-10-CM

## 2015-10-15 LAB — MICROSCOPIC EXAMINATION: RBC, UA: NONE SEEN /hpf (ref 0–?)

## 2015-10-15 LAB — URINALYSIS, COMPLETE
BILIRUBIN UA: NEGATIVE
GLUCOSE, UA: NEGATIVE
KETONES UA: NEGATIVE
Nitrite, UA: NEGATIVE
PROTEIN UA: NEGATIVE
RBC UA: NEGATIVE
SPEC GRAV UA: 1.015 (ref 1.005–1.030)
Urobilinogen, Ur: 0.2 mg/dL (ref 0.2–1.0)
pH, UA: 7 (ref 5.0–7.5)

## 2015-10-15 MED ORDER — ALBUTEROL SULFATE (2.5 MG/3ML) 0.083% IN NEBU: INHALATION_SOLUTION | RESPIRATORY_TRACT | 0 refills | Status: DC

## 2015-10-15 MED ORDER — DULOXETINE HCL 30 MG PO CPEP: 30.0000 mg | ORAL_CAPSULE | Freq: Every day | ORAL | 0 refills | Status: DC

## 2015-10-15 MED ORDER — LORAZEPAM 0.5 MG PO TABS: 0.5000 mg | ORAL_TABLET | Freq: Three times a day (TID) | ORAL | 1 refills | Status: DC | PRN

## 2015-10-15 MED ORDER — CETIRIZINE HCL 10 MG PO TABS: 10.0000 mg | ORAL_TABLET | Freq: Every day | ORAL | 11 refills | Status: DC

## 2015-10-15 MED ORDER — LORAZEPAM 0.5 MG PO TABS
0.5000 mg | ORAL_TABLET | Freq: Three times a day (TID) | ORAL | 3 refills | Status: DC | PRN
Start: 2015-10-15 — End: 2016-02-17

## 2015-10-15 MED ORDER — FLUTICASONE-SALMETEROL 500-50 MCG/DOSE IN AEPB: 1.0000 | INHALATION_SPRAY | Freq: Two times a day (BID) | RESPIRATORY_TRACT | 11 refills | Status: AC

## 2015-10-15 NOTE — Telephone Encounter (Signed)
Rx resent.

## 2015-10-15 NOTE — Patient Instructions (Addendum)
Great to see you!  For your sinuses:  Try Warm compresses to the face, start nasal saline rinses 3-4 times a day Start plain zyrtec or claritin, I have prescribed this but it is avaliable over the counter  Come back if you are getting worse or are not better by Monday.   I am starting you on cymbalta which I think will help with your pain and anxiety. Talke 1 pill once a day  Come back in 3 weeks to follow up for anxiety

## 2015-10-15 NOTE — Progress Notes (Signed)
HPI  Patient presents today here for facial pain, concern for UTI, anxiety, and back and hand pain.  Facial pain and congestion. Three-day history of facial pain and congestion, also with rhinorrhea. No cough or shortness of breath. No fevers or chills. Patient has been using Flonase with some improvement.  Dysuria Mild dysuria for 3-4 days. No fever, chills, sweats, or abdominal pain. Mild back pain.  Back pain Increase with movement, states this is similar to previous episodes.  Right hand pain, similar to left hand pain treated last visit. Left hand was found to have chronic dislocation and has improved quite a bit with a simple wrist brace.  Anxiety Patient cannot clearly endorse feelings of anxiety, however she states that she was started on Ativan to "calm her nerves". She uses one every night to sleep, she also uses one as needed in the morning. She denies suicidal thoughts or depression.    PMH: Smoking status noted ROS: Per HPI  Objective: BP 115/69 (BP Location: Right Arm, Patient Position: Sitting, Cuff Size: Normal)   Pulse 70   Temp (!) 96.7 F (35.9 C) (Oral)   Ht 5' 4.5" (1.638 m)   Wt 140 lb 12.8 oz (63.9 kg)   BMI 23.80 kg/m  Gen: NAD, alert, cooperative with exam HEENT: NCAT, bilateral tenderness to palpation of the maxillary and frontal sinuses, nares with some swelling of the turbinates, oropharynx clear CV: RRR, good S1/S2, no murmur Resp: CTABL, no wheezes, non-labored Abd: SNTND, BS present, no guarding or organomegaly Ext: No edema, warm Neuro: Alert and oriented, No gross deficits  Musculoskeletal Mild tenderness to palpation of bilateral paraspinal muscles No midline tenderness  Assessment and plan:  # Sinus pressure With 3 days of symptoms and recent antibiotic use, I recommended that she maximize over-the-counter supportive care including nasal saline rinses, continue Flonase, warm compresses to the face bilaterally, and starting  antihistamine. Follow-up if not improving in 4 days.  # Dysuria Previous UTI with Klebsiella pneumoniae and Escherichia coli Improved after Bactrim last month Urinalysis today is positive for trace leukocyte esterase, will send for culture given her symptoms Omnicef would be reasonable choice for coverage of sinus pain/infection and UTI  # Anxiety Refilled lorazepam, I have discussed with her that without controller medication I'm unwilling to continue the medication at current dose. Starting Cymbalta, I believe this will also help with back pain. She denies suicidal thoughts, follow-up 3-4 weeks   If she has a persistent need of tramadol and lorazepam we will pursue placing her on the controlled substance contract.   Orders Placed This Encounter  Procedures  . Urine culture  . Urinalysis, Complete    Meds ordered this encounter  Medications  . DISCONTD: albuterol (PROVENTIL) (2.5 MG/3ML) 0.083% nebulizer solution    Sig: USE ONE VIAL IN NEBULIZER EVERY 6 HOURS AS NEEDED FOR WHEEZING OR SHORTNESS OF BREATH    Dispense:  150 mL    Refill:  0  . Fluticasone-Salmeterol (ADVAIR DISKUS) 500-50 MCG/DOSE AEPB    Sig: Inhale 1 puff into the lungs 2 (two) times daily.    Dispense:  1 each    Refill:  11  . DISCONTD: LORazepam (ATIVAN) 0.5 MG tablet    Sig: Take 1 tablet (0.5 mg total) by mouth every 8 (eight) hours as needed.    Dispense:  30 tablet    Refill:  1    Not to exceed 5 additional fills before 01/13/2016.  . cetirizine (ZYRTEC) 10 MG tablet  Sig: Take 1 tablet (10 mg total) by mouth daily.    Dispense:  30 tablet    Refill:  11  . DULoxetine (CYMBALTA) 30 MG capsule    Sig: Take 1 capsule (30 mg total) by mouth daily.    Dispense:  30 capsule    Refill:  0  . DISCONTD: LORazepam (ATIVAN) 0.5 MG tablet    Sig: Take 1 tablet (0.5 mg total) by mouth every 8 (eight) hours as needed.    Dispense:  30 tablet    Refill:  1    Not to exceed 5 additional fills before  01/13/2016.  Marland Kitchen. LORazepam (ATIVAN) 0.5 MG tablet    Sig: Take 1 tablet (0.5 mg total) by mouth every 8 (eight) hours as needed.    Dispense:  60 tablet    Refill:  3    Not to exceed 5 additional fills before 01/13/2016.    Murtis SinkSam Suhaylah Wampole, MD [facility] 10/15/2015, 10:47 AM

## 2015-10-16 LAB — URINE CULTURE

## 2015-10-17 ENCOUNTER — Ambulatory Visit: Payer: Medicare Other | Admitting: Family Medicine

## 2015-10-28 ENCOUNTER — Ambulatory Visit: Payer: Medicare Other | Admitting: Pharmacist

## 2015-11-13 ENCOUNTER — Encounter: Payer: Self-pay | Admitting: Gastroenterology

## 2015-11-13 ENCOUNTER — Ambulatory Visit (INDEPENDENT_AMBULATORY_CARE_PROVIDER_SITE_OTHER): Payer: Medicare Other | Admitting: Family Medicine

## 2015-11-13 ENCOUNTER — Encounter: Payer: Self-pay | Admitting: Family Medicine

## 2015-11-13 VITALS — BP 113/69 | HR 76 | Temp 97.4°F | Ht 64.5 in | Wt 138.6 lb

## 2015-11-13 DIAGNOSIS — J0100 Acute maxillary sinusitis, unspecified: Secondary | ICD-10-CM

## 2015-11-13 DIAGNOSIS — F411 Generalized anxiety disorder: Secondary | ICD-10-CM

## 2015-11-13 DIAGNOSIS — J449 Chronic obstructive pulmonary disease, unspecified: Secondary | ICD-10-CM

## 2015-11-13 DIAGNOSIS — Z0000 Encounter for general adult medical examination without abnormal findings: Secondary | ICD-10-CM

## 2015-11-13 MED ORDER — AMOXICILLIN-POT CLAVULANATE 875-125 MG PO TABS: 1.0000 | ORAL_TABLET | Freq: Two times a day (BID) | ORAL | 0 refills | Status: DC

## 2015-11-13 MED ORDER — DULOXETINE HCL 30 MG PO CPEP: 30.0000 mg | ORAL_CAPSULE | Freq: Every day | ORAL | 5 refills | Status: DC

## 2015-11-13 NOTE — Progress Notes (Signed)
   HPI  Patient presents today here with cough and follow-up for generalized anxiety disorder.  Patient is taking Ativan intermittently, she's tolerating Cymbalta easily. She states that at the first 2 days of Cymbalta she had upset stomach and nausea. Now she has no symptoms, she continues to take it. No suicidal thoughts or depression.  Patient does not want a pneumonia shot.  She has COPD, she reports persistent productive cough for 4 days. She also reports bilateral facial pain and pressure with nasal congestion. She's taking allergy medications as prescribed.   PMH: Smoking status noted ROS: Per HPI  Objective: BP 113/69   Pulse 76   Temp 97.4 F (36.3 C) (Oral)   Ht 5' 4.5" (1.638 m)   Wt 138 lb 9.6 oz (62.9 kg)   BMI 23.42 kg/m  Gen: NAD, alert, cooperative with exam HEENT: NCAT, bilateral maxillary sinus tenderness to palpation, TMs normal bilaterally, oropharynx clear CV: RRR, good S1/S2, no murmur Resp: CTABL, no wheezes, non-labored Ext: No edema, warm Neuro: Alert and oriented, No gross deficits  Assessment and plan:  # Anxiety Tolerating Cymbalta well, refilled. Symptoms are stable Continue Ativan at current dose for now 2 months follow-up  # Acute maxillary sinusitis, COPD exacerbation Treat with Augmentin Patient refuses prednisone today, her lung exam is also reassuring. Augmentin chosen to cover primarily acute maxillary sinusitis, however with her cough has a COPD exacerbation has to be considered.  # Healthcare maintenance GI referral written for colonoscopy, patient is willing Fuses pneumonia and flu shot. Has annual wellness visit next month    Orders Placed This Encounter  Procedures  . Ambulatory referral to Gastroenterology    Referral Priority:   Routine    Referral Type:   Consultation    Referral Reason:   Specialty Services Required    Number of Visits Requested:   1    Meds ordered this encounter  Medications  .  amoxicillin-clavulanate (AUGMENTIN) 875-125 MG tablet    Sig: Take 1 tablet by mouth 2 (two) times daily.    Dispense:  20 tablet    Refill:  0  . DULoxetine (CYMBALTA) 30 MG capsule    Sig: Take 1 capsule (30 mg total) by mouth daily.    Dispense:  30 capsule    Refill:  5    Murtis SinkSam Bradshaw, MD [facility] 11/13/2015, 9:25 AM

## 2015-11-13 NOTE — Patient Instructions (Addendum)
Great to see you!  I have sent an antibiotic to help with your cough and facial pain.   I have also sent a referral to arrange for your colonoscopy  Consider a flu shot and pneumonia shot  Follow up in 2 months for anxiety

## 2015-11-17 ENCOUNTER — Encounter: Payer: Self-pay | Admitting: Pharmacist

## 2015-11-17 ENCOUNTER — Other Ambulatory Visit: Payer: Self-pay | Admitting: Family Medicine

## 2015-11-17 ENCOUNTER — Ambulatory Visit (INDEPENDENT_AMBULATORY_CARE_PROVIDER_SITE_OTHER): Payer: Medicare Other | Admitting: Pharmacist

## 2015-11-17 VITALS — BP 122/70 | HR 72 | Ht 65.0 in | Wt 140.0 lb

## 2015-11-17 DIAGNOSIS — Z0000 Encounter for general adult medical examination without abnormal findings: Secondary | ICD-10-CM | POA: Diagnosis not present

## 2015-11-17 DIAGNOSIS — J441 Chronic obstructive pulmonary disease with (acute) exacerbation: Secondary | ICD-10-CM

## 2015-11-17 MED ORDER — RISEDRONATE SODIUM 35 MG PO TBEC: 1.0000 | DELAYED_RELEASE_TABLET | ORAL | 11 refills | Status: DC

## 2015-11-17 MED ORDER — VITAMIN D (CHOLECALCIFEROL) 25 MCG (1000 UT) PO TABS: 1000.0000 [IU] | ORAL_TABLET | Freq: Every day | ORAL | 0 refills | Status: AC

## 2015-11-17 NOTE — Progress Notes (Signed)
Patient ID: Lindsey May, female   DOB: Mar 26, 1950, 65 y.o.   MRN: [id number]    Subjective:   Lindsey May is a 65 y.o. female who presents for a subsequent Medicare Annual Wellness Visit.  Lindsey May lives alone in [place], [place].  She has been divorced since 38. She moved to [place] about 18 months ago after being exposed to lead in her home that she had lived in for 4 years. 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  She has two sons. She does not drive. She depends on her sons and a close friend for transportation.  She has had difficulty getting furnishings and clothing as she has to leave all her belongings due to exposure to lead paint.   Lindsey May was seen last week for sinus infection and is currently taking generic Augmentin.   Current Medications (verified) Outpatient Encounter Prescriptions as of 11/17/2015  Medication Sig  . albuterol (PROVENTIL HFA;VENTOLIN HFA) 108 (90 Base) MCG/ACT inhaler Inhale 2 puffs into the lungs every 6 (six) hours as needed for wheezing or shortness of breath.  Marland Kitchen albuterol (PROVENTIL) (2.5 MG/3ML) 0.083% nebulizer solution USE ONE VIAL IN NEBULIZER EVERY 6 HOURS AS NEEDED FOR WHEEZING OR SHORTNESS OF BREATH  . amoxicillin-clavulanate (AUGMENTIN) 875-125 MG tablet Take 1 tablet by mouth 2 (two) times daily.  Marland Kitchen aspirin EC 81 MG tablet Take 1 tablet (81 mg total) by mouth daily.  . cetirizine (ZYRTEC) 10 MG tablet Take 1 tablet (10 mg total) by mouth daily.  . DULoxetine (CYMBALTA) 30 MG capsule Take 1 capsule (30 mg total) by mouth daily.  . fluticasone (FLONASE) 50 MCG/ACT nasal spray USE 2 SPRAYS IN EACH NOSTRIL DAILY  . Fluticasone-Salmeterol (ADVAIR DISKUS) 500-50 MCG/DOSE AEPB Inhale 1 puff into the lungs 2 (two) times daily.  Marland Kitchen LORazepam (ATIVAN) 0.5 MG tablet Take 1 tablet (0.5 mg total) by mouth every 8 (eight) hours as needed.  . meclizine (ANTIVERT) 25 MG tablet TAKE 1 TABLET (25 MG TOTAL) BY MOUTH 3 (THREE) TIMES DAILY AS NEEDED FOR DIZZINESS.    . Multiple Vitamins-Iron (MULTI-VITAMIN/IRON PO) Take 1 tablet by mouth daily.  Marland Kitchen nystatin cream (MYCOSTATIN) Apply 1 application topically at bedtime.  Marland Kitchen rOPINIRole (REQUIP) 0.25 MG tablet TAKE 1 TABLET AT BEDTIME THEN CAN GO TO 2 TABLETS AT BEDTIME IF ONE TAB DOESNT HELP (Patient taking differently: Take 1 to 2 tablets at bedtime)  . traMADol (ULTRAM) 50 MG tablet Take 1 tablet (50 mg total) by mouth every 8 (eight) hours as needed.  . trimethoprim-polymyxin b (POLYTRIM) ophthalmic solution PLACE 1 DROP INTO BOTH EYES EVERY 4 (FOUR) HOURS AS NEEDED  . Risedronate Sodium 35 MG TBEC Take 1 tablet (35 mg total) by mouth once a week. Can be taken with or without food.  Take with 4 ounces of liquid.  . Vitamin D, Cholecalciferol, 1000 units TABS Take 1,000 Units by mouth daily.   No facility-administered encounter medications on file as of 11/17/2015.     Allergies (verified) Zithromax [azithromycin] and Codeine   History: Past Medical History:  Diagnosis Date  . Allergy   . Anxiety   . Arthritis   . COPD (chronic obstructive pulmonary disease) (HCC)   . Depression   . Fibromyalgia   . Osteoporosis   . Vertigo    Past Surgical History:  Procedure Laterality Date  . TUBAL LIGATION  1974   Family History  Problem Relation Age of Onset  . Heart disease Mother   . Diabetes  Mother   . Heart failure Mother   . Edema Mother   . Heart disease Father   . Heart failure Father   . Heart disease Son   . Stroke Son    Social History   Occupational History  . Disabled     Daycare   Social History Main Topics  . Smoking status: Former Smoker    Packs/day: 0.25    Years: 20.00    Types: Cigarettes    Start date: 06/25/1966    Quit date: 12/31/2013  . Smokeless tobacco: Never Used  . Alcohol use No  . Drug use: No  . Sexual activity: No    Do you feel safe at home?  Yes Are there smokers in your home (other than you)? No  Dietary issues and exercise activities: Current  Exercise Habits: Home exercise routine, Type of exercise: walking, Time (Minutes): 20, Frequency (Times/Week): 5, Weekly Exercise (Minutes/Week): 100, Intensity: Mild  Current Dietary habits:  Patient states that her income is small and she has difficulty getting enough food to last throughout the month.  She has gotten assistance from a [employer] food [employer].    Objective:    Today's Vitals   11/17/15 0946  BP: 122/70  Pulse: 72  Weight: 140 lb (63.5 kg)  Height: 5\' 5"  (1.651 m)  PainSc: 4   PainLoc: Back   Body mass index is 23.3 kg/m.  Activities of Daily Living In your present state of health, do you have any difficulty performing the following activities: 11/17/2015  Hearing? N  Vision? Y  Difficulty concentrating or making decisions? N  Walking or climbing stairs? N  Dressing or bathing? N  Doing errands, shopping? N  Preparing Food and eating ? N  Using the Toilet? N  In the past six months, have you accidently leaked urine? N  Do you have problems with loss of bowel control? N  Managing your Medications? N  Managing your Finances? N  Housekeeping or managing your Housekeeping? N  Some recent data might be hidden     Cardiac Risk Factors include: advanced age (>2455men, 46>65 women);family history of premature cardiovascular disease  Depression Screen PHQ 2/9 Scores 11/17/2015 11/13/2015 10/15/2015 09/19/2015  PHQ - 2 Score 0 0 0 0  PHQ- 9 Score - - - -     Fall Risk Fall Risk  11/17/2015 11/13/2015 10/15/2015 09/19/2015 05/19/2015  Falls in the past year? No No No No No    Cognitive Function: MMSE - Mini Mental State Exam 11/17/2015 11/17/2015 06/25/2014  Orientation to time - 5 5  Orientation to Place - 4 5  Registration - 3 3  Attention/ Calculation - 5 5  Recall - 1 3  Language- name 2 objects - 2 2  Language- repeat - 1 1  Language- follow 3 step command 3 3 3   Language- read & follow direction 1 1 1   Write a sentence 1 1 1   Copy design 1 1 1   Total score - 27 30      Immunizations and Health Maintenance Immunization History  Administered Date(s) Administered  . Pneumococcal Conjugate-13 07/26/2014  . Tdap 06/25/2014  . Zoster 06/25/2014   Health Maintenance Due  Topic Date Due  . Hepatitis C Screening  09/24/1950  . COLONOSCOPY  06/25/1998  . MAMMOGRAM  10/24/2014  . PNA vac Low Risk Adult (2 of 2 - PPSV23) 07/26/2015  . INFLUENZA VACCINE  09/30/2015    Patient Care Team: Elenora GammaSamuel L Bradshaw, MD as  PCP - General (Family Medicine)  Indicate any recent Medical Services you may have received from other than [facility] providers in the past year (date may be approximate).    Assessment:    Annual Wellness Visit  Osteoporosis - patient did not pick up alendronate prescribed last year   Screening Tests Health Maintenance  Topic Date Due  . Hepatitis C Screening  December 13, 1950  . COLONOSCOPY  06/25/1998  . MAMMOGRAM  10/24/2014  . PNA vac Low Risk Adult (2 of 2 - PPSV23) 07/26/2015  . INFLUENZA VACCINE  09/30/2015  . DEXA SCAN  06/24/2016  . TETANUS/TDAP  06/24/2024  . ZOSTAVAX  Completed        Plan:   During the course of the visit Lindsey May was educated and counseled about the following appropriate screening and preventive services:   Vaccines to include Pneumoccal, Influenza, Td, Zostavax - since currently taking ABX will hold off on any vaccines today since this is office policy.  Patient declined influenza vaccine but agrees to get last pneumo vaccines  Colorectal cancer screening - scheduled for colonoscopy within 1 month  Cardiovascular disease screening - no EKG on files.  Patient is due lipids - will get at next appt with PCP.  BP is at goal  Diabetes screening - last FBG was WNL  Bone Denisty / Osteoporosis Screening - start generic Atelvia 150mg  take 1 tablet weekly with or without food.  Take wth 4 ounces of liquid.  Mammogram - has appt 12/23/15  Glaucoma screening /  Eye Exam - UTD  Nutrition counseling - referred patient  for Meals on Wheels in [place] but the state that she actually lives in [place].  I left a message for Administrator of meals on wheels in [place] [phone]. Will also see if social worker for [facility] can visit her for assistant  Advanced Directives - patient has information already  Physical Activity - continue to walk as able  Pt requested medic alert bracelet for hypoglycemia - will order for her as she does not have access to computer.   Change from ASA 325mg  to 81mg  daily   Patient Instructions (the written plan) were given to the patient.   Henrene Pastor, PharmD   11/17/2015

## 2015-11-17 NOTE — Patient Instructions (Addendum)
  Ms. Lindsey May , Thank you for taking time to come for your Medicare Wellness Visit. I appreciate your ongoing commitment to your health goals. Please review the following plan we discussed and let me know if I can assist you in the future.   These are the goals we discussed: Continue to walk and exercise - goal is 150 minutes per week.   Increase non-starchy vegetables - carrots, green bean, squash, zucchini, tomatoes, onions, peppers, spinach and other green leafy vegetables, cabbage, lettuce, cucumbers, asparagus, okra (not fried), eggplant Limit sugar and processed foods (cakes, cookies, ice cream, crackers and chips) Increase fresh fruit but limit serving sizes 1/2 cup or about the size of tennis or baseball Limit red meat to no more than 1-2 times per week (serving size about the size of your palm) Choose whole grains / lean proteins - whole wheat bread, quinoa, whole grain rice (1/2 cup), fish, chicken, [place]urkey Avoid sugar and calorie containing beverages - soda, sweet tea and juice.  Choose water or unsweetened tea instead.   This is a list of the screening recommended for you and due dates:  Health Maintenance  Topic Date Due  .  Hepatitis C: One time screening is recommended by Center for Disease Control  (CDC) for  adults born from [birth year] through 1967.   [birth year] - will get with next lab work   . Colon Cancer Screening  06/25/1998 - has appointment for 10/2015  . Mammogram  10/24/2014 - has appointment for 12/23/2015  . Pneumonia vaccines (2 of 2 - PPSV23) 07/26/2015 - will get at next appointment 12/2015 since you are currently on an antibiotic  . Flu Shot  09/30/2015  . DEXA scan (bone density measurement)  06/24/2016  . Tetanus Vaccine  06/24/2024  . Shingles Vaccine  Completed

## 2015-11-18 ENCOUNTER — Other Ambulatory Visit: Payer: Self-pay | Admitting: Family

## 2015-11-18 DIAGNOSIS — J208 Acute bronchitis due to other specified organisms: Secondary | ICD-10-CM

## 2015-11-20 ENCOUNTER — Telehealth: Payer: Self-pay | Admitting: Pharmacist

## 2015-11-20 NOTE — Telephone Encounter (Signed)
Made referral for Meals on Wheels.  [facility] believes she will qualify but they will have to check to see if there are openings.  Patient notified.

## 2015-11-26 ENCOUNTER — Other Ambulatory Visit: Payer: Self-pay | Admitting: *Deleted

## 2015-11-26 NOTE — Patient Outreach (Signed)
[facility]) Care Management  11/26/2015  Lindsey May Aug 03, 1950 [id number]  Referral received from [facility]-pharmacist: (assess social & medication needs)-DX COPD/pneumonia  Telephone call to patient who was advised of reason for call & [facility] services.  HIPPA verification received from patient.   Patient voices that she lives alone & is independent with her care. States her son provides transportation for her and lives nearby.   Health conditions include COPD, Osteoporosis, Fibromyalgia, Lead poisoning/exposure, Frequent UTI's, Hypoglycemia States getting medications from local pharmacy without problems. States Medicaid benefit usually pays for medication on co pay.  States co pay is usually affordable but occasionally co pay for COPD medications can be higher than she can afford.  Currently she has all the medications that she needs and manages it herself. States taking consistently as instructed by doctor.   States son takes her to food pantry and they don't always have foods that she needs. States she has to eat 6 small meals daily because of her hypoglycemia. States monthly check sometimes does not last til end of month due to monthly bills.  States she had to leave her home & everything in it several years ago due to lead contamination. States she has had to start all over & does not have resources to buy more clothing & other needed items.    Patient interested in referral to Clinical Social Worker for [employer] and referral to [employer] Health Coach for disease management.    Plan: Refer to Clinical Social worker for [employer]. Refer to RN Health Coach for disease management (COPD) Send to care management assistant.  Lindsey CanLinda Arin Vanosdol, RN BSN CCM Care Management Coordinator [facility] Care Management  [phone]

## 2015-11-29 ENCOUNTER — Other Ambulatory Visit: Payer: Self-pay | Admitting: Family Medicine

## 2015-12-01 ENCOUNTER — Other Ambulatory Visit: Payer: Self-pay | Admitting: Family Medicine

## 2015-12-04 ENCOUNTER — Other Ambulatory Visit: Payer: Self-pay

## 2015-12-04 NOTE — Patient Outreach (Signed)
[facility]) Care Management  12/04/2015  Lindsey May [birth year] [id number]   Telephone call to patient for initial assessment.  Patient states that she is trying to get her medication and if health coach could call back.  Advised patient health coach would call another time.  She verbalized understanding.   Plan: RN Health Coach will call patient again in the month of October.    Bary Lericheionne J Corynne Scibilia, RN, MSN [facility] Care Management RN Telephonic Health Coach [phone]

## 2015-12-09 ENCOUNTER — Other Ambulatory Visit: Payer: Self-pay

## 2015-12-09 ENCOUNTER — Other Ambulatory Visit: Payer: Self-pay | Admitting: Licensed Clinical Social Worker

## 2015-12-09 ENCOUNTER — Encounter: Payer: Self-pay | Admitting: Licensed Clinical Social Worker

## 2015-12-09 NOTE — Patient Outreach (Signed)
Assessment:  CSW received referral on Lindsey May. CSW completed chart review on Lindsey May on 12/09/15.  Client sees Dr. Ermalinda May as primary care doctor. Client has prescribed medications and is taking medications as prescribed. Client is receiving support from [facility] Health Coach, Lindsey May. Client has support from her son who lives nearby. Son of client transports client to and from client's scheduled medical appointments.  CSW spoke via phone with client on 12/09/15. CSW verified client identity. CSW received verbal permission from Lindsey May on 12/09/15 for CSW to speak with Lindsey May about her current needs. Client said she has been able to afford most of her medications. She has Medicaid.  Client said that her son takes her periodically to local food pantry for client to receive needed food items.  Client said she eats 6 small meals daily due to her health needs. CSW and client spoke of client care plan.  CSW encouraged client to communicate with CSW in next 30 days to discuss community resources for food assistance for client.  CSW spoke with client on 12/09/15 about [facility] agency in [place], [place]. CSW gave client the name, address and phone number of [facility]. CSW encouraged client to contact that agency to seek food support and assistance for client.  CSW and client completed needed [facility] assessments.  CSW gave client the name, address, and phone number of [facility].  CSW spoke with client about application process for food support with that agency. CSW encouraged client to complete application for food assistance through [facility]. [facility] [place] for her prescribed medications. Client said her son lives nearby in the area and is very supportive of client.  CSW gave client the phone number of CSW ([phone]) and encouraged Lindsey May to call CSW as needed to discuss social work needs of client.  Lindsey May was appreciative of  phone call from CSW on 12/09/15.   Plan:  Client to communicate with CSW in next 30 days to discuss community resources for food assistance for client.  CSW to collaborate with [facility] Health Coach Fleeta Emmerionne Leath, RN, in monitoring needs of client.  CSW to call client in 4 weeks to assess client needs.  Kelton PillarMichael S.Blane Worthington MSW, LCSW Licensed Clinical Social Worker [facility] Care Management [phone]

## 2015-12-09 NOTE — Patient Outreach (Signed)
[facility]) Care Management  12/09/2015  Lindsey May August 05, 1950 [id number]   Telephone call to patient for initial assessment.  No answer. Unable to leave a message.    Plan: RN Health Coach will attempt patient again in the month of October.   Bary Lericheionne J Kuzey Ogata, RN, MSN [facility] Care Management RN Telephonic Health Coach [phone]

## 2015-12-10 ENCOUNTER — Encounter: Payer: Self-pay | Admitting: Gastroenterology

## 2015-12-15 ENCOUNTER — Ambulatory Visit: Payer: Self-pay

## 2015-12-17 ENCOUNTER — Other Ambulatory Visit: Payer: Self-pay

## 2015-12-17 NOTE — Patient Outreach (Addendum)
[facility]) Care Management  12/17/2015  Lindsey May 1950-07-31 [id number]   3rd Telephone call to patient for initial assessment. Patient states she is in the store right now but has number on her phone and will call back once she gets home.  Plan: RN Health Coach will await patient return call.   RN Health Coach will send letter for outreach. If no response in 10 business days will proceed with case closure.   Bary Lericheionne J Cai Anfinson, RN, MSN [facility] Care Management RN Telephonic Health Coach [phone]

## 2015-12-23 ENCOUNTER — Encounter: Payer: Self-pay | Admitting: Family Medicine

## 2015-12-23 ENCOUNTER — Ambulatory Visit (INDEPENDENT_AMBULATORY_CARE_PROVIDER_SITE_OTHER): Payer: Medicare Other | Admitting: Family Medicine

## 2015-12-23 ENCOUNTER — Encounter: Payer: Medicare Other | Admitting: *Deleted

## 2015-12-23 VITALS — BP 108/73 | HR 74 | Temp 96.9°F | Ht 65.0 in | Wt 142.2 lb

## 2015-12-23 DIAGNOSIS — J40 Bronchitis, not specified as acute or chronic: Secondary | ICD-10-CM

## 2015-12-23 DIAGNOSIS — Z1231 Encounter for screening mammogram for malignant neoplasm of breast: Secondary | ICD-10-CM | POA: Diagnosis not present

## 2015-12-23 DIAGNOSIS — J441 Chronic obstructive pulmonary disease with (acute) exacerbation: Secondary | ICD-10-CM | POA: Diagnosis not present

## 2015-12-23 DIAGNOSIS — R109 Unspecified abdominal pain: Secondary | ICD-10-CM

## 2015-12-23 DIAGNOSIS — J329 Chronic sinusitis, unspecified: Secondary | ICD-10-CM | POA: Diagnosis not present

## 2015-12-23 LAB — URINALYSIS, COMPLETE
Bilirubin, UA: NEGATIVE
Glucose, UA: NEGATIVE
Ketones, UA: NEGATIVE
NITRITE UA: NEGATIVE
Protein, UA: NEGATIVE
RBC, UA: NEGATIVE
SPEC GRAV UA: 1.01 (ref 1.005–1.030)
Urobilinogen, Ur: 0.2 mg/dL (ref 0.2–1.0)
pH, UA: 7 (ref 5.0–7.5)

## 2015-12-23 LAB — MICROSCOPIC EXAMINATION
Bacteria, UA: NONE SEEN
RBC MICROSCOPIC, UA: NONE SEEN /HPF (ref 0–?)

## 2015-12-23 MED ORDER — AMOXICILLIN-POT CLAVULANATE 875-125 MG PO TABS: 1.0000 | ORAL_TABLET | Freq: Two times a day (BID) | ORAL | 0 refills | Status: DC

## 2015-12-23 NOTE — Progress Notes (Signed)
Subjective:  Patient ID: Lindsey May, female    DOB: Apr 06, 1950  Age: 65 y.o. MRN: [id number]  CC: Flank Pain (pt here today c/o flank pain and some dysuria)   HPI Lindsey May presents for Symptoms include congestion, facial pain, nasal congestion,  productive cough, post nasal drip and sinus pressure with 102 degree fever, chills, night sweats last 3 days. Onset of symptoms was 4days ago, gradually worsening since that time. Not dyspneic. Some low back pain, shoulder pain.The flank pain is actually incidental and mild and resolving. She denies dysuria at this time. It is unclear why she changed her history between the nurse evaluation and my interview with her. However I verified twice.  History Lindsey May has a past medical history of Allergy; Anxiety; Arthritis; COPD (chronic obstructive pulmonary disease) (HCC); Depression; Fibromyalgia; Osteoporosis; and Vertigo.   She has a past surgical history that includes Tubal ligation (1974).   Her family history includes Diabetes in her mother; Edema in her mother; Heart disease in her father, mother, and son; Heart failure in her father and mother; Stroke in her son.She reports that she quit smoking about 1 years ago. Her smoking use included Cigarettes. She started smoking about 49 years ago. She has a 5.00 pack-year smoking history. She has never used smokeless tobacco. She reports that she does not drink alcohol or use drugs.  Current Outpatient Prescriptions on File Prior to Visit  Medication Sig Dispense Refill  . albuterol (PROVENTIL) (2.5 MG/3ML) 0.083% nebulizer solution USE ONE VIAL IN NEBULIZER EVERY 6 HOURS AS NEEDED FOR WHEEZING OR SHORTNESS OF BREATH 150 mL 0  . aspirin EC 81 MG tablet Take 1 tablet (81 mg total) by mouth daily.    . cetirizine (ZYRTEC) 10 MG tablet Take 1 tablet (10 mg total) by mouth daily. 30 tablet 11  . DULoxetine (CYMBALTA) 30 MG capsule TAKE 1 CAPSULE (30 MG TOTAL) BY MOUTH DAILY. 30 capsule 2  . fluticasone  (FLONASE) 50 MCG/ACT nasal spray USE 2 SPRAYS IN EACH NOSTRIL DAILY 16 g 3  . Fluticasone-Salmeterol (ADVAIR DISKUS) 500-50 MCG/DOSE AEPB Inhale 1 puff into the lungs 2 (two) times daily. 1 each 11  . LORazepam (ATIVAN) 0.5 MG tablet Take 1 tablet (0.5 mg total) by mouth every 8 (eight) hours as needed. 60 tablet 3  . meclizine (ANTIVERT) 25 MG tablet TAKE 1 TABLET (25 MG TOTAL) BY MOUTH 3 (THREE) TIMES DAILY AS NEEDED FOR DIZZINESS. 30 tablet 0  . Multiple Vitamins-Iron (MULTI-VITAMIN/IRON PO) Take 1 tablet by mouth daily.    Marland Kitchen nystatin cream (MYCOSTATIN) Apply 1 application topically at bedtime. 30 g 1  . Risedronate Sodium 35 MG TBEC Take 1 tablet (35 mg total) by mouth once a week. Can be taken with or without food.  Take with 4 ounces of liquid. 4 tablet 11  . rOPINIRole (REQUIP) 0.25 MG tablet TAKE 1 TABLET AT BEDTIME THEN CAN GO TO 2 TABLETS AT BEDTIME IF ONE TAB DOESNT HELP 60 tablet 5  . traMADol (ULTRAM) 50 MG tablet Take 1 tablet (50 mg total) by mouth every 8 (eight) hours as needed. 30 tablet 2  . trimethoprim-polymyxin b (POLYTRIM) ophthalmic solution PLACE 1 DROP INTO BOTH EYES EVERY 4 (FOUR) HOURS AS NEEDED 10 mL 3  . VENTOLIN HFA 108 (90 Base) MCG/ACT inhaler INHALE 2 PUFFS INTO THE LUNGS EVERY 6 (SIX) HOURS AS NEEDED FOR WHEEZING OR SHORTNESS OF BREATH. 18 Inhaler 5  . Vitamin D, Cholecalciferol, 1000 units TABS Take 1,000 Units  by mouth daily. 100 tablet 0   No current facility-administered medications on file prior to visit.     ROS Review of Systems  Constitutional: Negative for activity change, appetite change, chills and fever.  HENT: Positive for congestion, postnasal drip, rhinorrhea and sinus pressure. Negative for ear discharge, ear pain, hearing loss, nosebleeds, sneezing, sore throat and trouble swallowing.   Eyes: Negative for visual disturbance.  Respiratory: Negative for cough, chest tightness and shortness of breath.   Cardiovascular: Negative for chest pain and  palpitations.  Gastrointestinal: Negative for abdominal pain, diarrhea and nausea.  Genitourinary: Negative for dysuria.  Musculoskeletal: Negative for arthralgias and myalgias.  Skin: Negative for rash.    Objective:  BP 108/73   Pulse 74   Temp (!) 96.9 F (36.1 C) (Oral)   Ht 5\' 5"  (1.651 m)   Wt 142 lb 4 oz (64.5 kg)   BMI 23.67 kg/m   Physical Exam  Constitutional: She is oriented to person, place, and time. She appears well-developed and well-nourished. No distress.  HENT:  Head: Normocephalic and atraumatic.  Right Ear: Tympanic membrane and external ear normal. No decreased hearing is noted.  Left Ear: Tympanic membrane and external ear normal. No decreased hearing is noted.  Nose: Mucosal edema present. Right sinus exhibits no frontal sinus tenderness. Left sinus exhibits no frontal sinus tenderness.  Mouth/Throat: Oropharynx is clear and moist. No oropharyngeal exudate or posterior oropharyngeal erythema.  Eyes: Conjunctivae and EOM are normal. Pupils are equal, round, and reactive to light.  Neck: Normal range of motion. Neck supple. No Brudzinski's sign noted. No thyromegaly present.  Cardiovascular: Normal rate, regular rhythm and normal heart sounds.   No murmur heard. Pulmonary/Chest: Effort normal and breath sounds normal. No respiratory distress. She has no wheezes. She has no rales.  Abdominal: Soft. Bowel sounds are normal. She exhibits no distension. There is no tenderness.  Lymphadenopathy:       Head (right side): No preauricular adenopathy present.       Head (left side): No preauricular adenopathy present.    She has no cervical adenopathy.       Right cervical: No superficial cervical adenopathy present.      Left cervical: No superficial cervical adenopathy present.  Neurological: She is alert and oriented to person, place, and time. She has normal reflexes.  Skin: Skin is warm and dry.  Psychiatric: She has a normal mood and affect. Her behavior is  normal. Judgment and thought content normal.    Assessment & Plan:   Lindsey May was seen today for flank pain.  Diagnoses and all orders for this visit:  Sinobronchitis  Flank pain -     Urinalysis, Complete  COPD exacerbation (HCC)  Other orders -     amoxicillin-clavulanate (AUGMENTIN) 875-125 MG tablet; Take 1 tablet by mouth 2 (two) times daily. Take all of this medication -     Microscopic Examination   I have discontinued Lindsey May's amoxicillin-clavulanate. I am also having her start on amoxicillin-clavulanate. Additionally, I am having her maintain her Multiple Vitamins-Iron (MULTI-VITAMIN/IRON PO), aspirin EC, nystatin cream, trimethoprim-polymyxin b, fluticasone, traMADol, Fluticasone-Salmeterol, cetirizine, LORazepam, albuterol, DULoxetine, Risedronate Sodium, Vitamin D (Cholecalciferol), VENTOLIN HFA, rOPINIRole, and meclizine.  Meds ordered this encounter  Medications  . amoxicillin-clavulanate (AUGMENTIN) 875-125 MG tablet    Sig: Take 1 tablet by mouth 2 (two) times daily. Take all of this medication    Dispense:  20 tablet    Refill:  0   I chose the  antibiotic so that if there is any residual cystitis which should be covered. However her urinalysis was essentially free of infection  Follow-up: Return if symptoms worsen or fail to improve.  Mechele ClaudeWarren Claudia Alvizo, M.D.

## 2015-12-30 ENCOUNTER — Other Ambulatory Visit: Payer: Self-pay | Admitting: Family Medicine

## 2015-12-30 ENCOUNTER — Encounter: Payer: Self-pay | Admitting: Gastroenterology

## 2016-01-01 ENCOUNTER — Other Ambulatory Visit: Payer: Self-pay

## 2016-01-01 NOTE — Patient Outreach (Signed)
[facility] Care Management  01/01/2016  Lindsey May 1950-04-12 [id number]   Three phone calls made to patient. Patient has not returned phone calls.  Letter sent to patient in attempt to reach.  No response.    Plan: RN Health Coach will close case and notify physician.    Bary Lericheionne J Chancelor Hardrick, RN, MSN [facility] Care Management RN Telephonic Health Coach [phone]

## 2016-01-03 ENCOUNTER — Other Ambulatory Visit: Payer: Self-pay | Admitting: Family Medicine

## 2016-01-06 ENCOUNTER — Other Ambulatory Visit: Payer: Self-pay | Admitting: Licensed Clinical Social Worker

## 2016-01-06 NOTE — Patient Outreach (Signed)
Assessment:  CSW spoke via phone with client. CSW verified client identity. CSW received verbal permission from client on 01/06/16 for CSW to speak with client about current client needs. Client sees Dr. Ermalinda MemosBradshaw as primary care doctor. Client has prescribed medications and is taking medications as prescribed. Client has support from her son. Her son transports client to and from client's scheduled medical appointments. Client has Medicaid. She said she has been able to afford her prescribed medications. She said her son periodically takes her to local food pantry to receive food support for client.  CSW and client spoke of client care plan. CSW encouraged client to communicate with CSW in next 30 days to discuss community resources for food assistance for client. CSW has previously provided client name of a local food support agency:  [employer] agency. CSW has given client phone number for that agency and encouraged her to call that agency to seek additional food assistance. CSW has previously given client name, address and phone number for [facility] and has encouraged her to contact that agency regarding application completion process for food support assistance. Client said that she has also been to local food pantry near her home to receive food assistance. She said food pantry is run by [employer] and is close to her home. Her son has taken her to that pantry for food support assistance.  Client said she had a medical appointment at office of Dr. Ermalinda MemosBradshaw in past two weeks. Client said she eats 6 small meals daily in order to help her manage hypoglycemia.  She said also that a representative of [employer] planned to visit her this week at her home to speak of possible food help for her with that program. She said she sometimes limits her intake of vegetables.  She said that recently, at food pantry she visited, she picked up several fruit items to help her with food needs.  CSW spoke with client about [facility] program support in nursing, social work and pharmacy areas.  CSW encouraged client to call [address] as part of [address] to talk with [employer] representative about local resources for food or clothing. Client said she would call [employer] to seek information about food and clothing support in her area. CSW encouraged client to call CSW at [phone] as needed to discuss social work needs of client.     Plan:  Client to communicate with CSW in next 30 days to discuss community resources for food assistance for client .  CSW to call client in 4 weeks to assess client needs.   Kelton PillarMichael S.Cornelious Diven MSW, LCSW Licensed Clinical Social Worker [facility] Care Management [phone]

## 2016-01-13 ENCOUNTER — Ambulatory Visit: Payer: Medicare Other | Admitting: Family Medicine

## 2016-01-14 ENCOUNTER — Telehealth: Payer: Self-pay | Admitting: Family Medicine

## 2016-01-14 ENCOUNTER — Encounter: Payer: Self-pay | Admitting: Family Medicine

## 2016-01-14 NOTE — Telephone Encounter (Signed)
Patient did not realize she missed appointment and re scheduled 11/21 at 10:25 with Ermalinda MemosBradshaw.

## 2016-01-20 ENCOUNTER — Ambulatory Visit: Payer: Self-pay | Admitting: Family Medicine

## 2016-01-21 ENCOUNTER — Encounter: Payer: Self-pay | Admitting: Family Medicine

## 2016-01-28 ENCOUNTER — Encounter: Payer: Self-pay | Admitting: Gastroenterology

## 2016-01-28 ENCOUNTER — Other Ambulatory Visit: Payer: Self-pay | Admitting: Licensed Clinical Social Worker

## 2016-01-28 DIAGNOSIS — J441 Chronic obstructive pulmonary disease with (acute) exacerbation: Secondary | ICD-10-CM

## 2016-01-28 NOTE — Patient Outreach (Signed)
[facility]) Care Management  [facility] Social Work  01/28/2016  Lindsey May 1950-08-25 [id number]  Subjective:    Objective:   Encounter Medications:  Outpatient Encounter Prescriptions as of 01/28/2016  Medication Sig  . albuterol (PROVENTIL) (2.5 MG/3ML) 0.083% nebulizer solution USE ONE VIAL IN NEBULIZER EVERY 6 HOURS AS NEEDED FOR WHEEZING OR SHORTNESS OF BREATH  . amoxicillin-clavulanate (AUGMENTIN) 875-125 MG tablet Take 1 tablet by mouth 2 (two) times daily. Take all of this medication  . aspirin EC 81 MG tablet Take 1 tablet (81 mg total) by mouth daily.  . cetirizine (ZYRTEC) 10 MG tablet Take 1 tablet (10 mg total) by mouth daily.  . DULoxetine (CYMBALTA) 30 MG capsule TAKE 1 CAPSULE (30 MG TOTAL) BY MOUTH DAILY.  . fluticasone (FLONASE) 50 MCG/ACT nasal spray USE 2 SPRAYS IN EACH NOSTRIL DAILY  . Fluticasone-Salmeterol (ADVAIR DISKUS) 500-50 MCG/DOSE AEPB Inhale 1 puff into the lungs 2 (two) times daily.  Marland Kitchen LORazepam (ATIVAN) 0.5 MG tablet Take 1 tablet (0.5 mg total) by mouth every 8 (eight) hours as needed.  . meclizine (ANTIVERT) 25 MG tablet TAKE 1 TABLET (25 MG TOTAL) BY MOUTH 3 (THREE) TIMES DAILY AS NEEDED FOR DIZZINESS.  . Multiple Vitamins-Iron (MULTI-VITAMIN/IRON PO) Take 1 tablet by mouth daily.  Marland Kitchen nystatin cream (MYCOSTATIN) APPLY 1 APPLICATION TOPICALLY AT BEDTIME.  Marland Kitchen Risedronate Sodium 35 MG TBEC Take 1 tablet (35 mg total) by mouth once a week. Can be taken with or without food.  Take with 4 ounces of liquid.  Marland Kitchen rOPINIRole (REQUIP) 0.25 MG tablet TAKE 1 TABLET AT BEDTIME THEN CAN GO TO 2 TABLETS AT BEDTIME IF ONE TAB DOESNT HELP  . traMADol (ULTRAM) 50 MG tablet Take 1 tablet (50 mg total) by mouth every 8 (eight) hours as needed.  . trimethoprim-polymyxin b (POLYTRIM) ophthalmic solution PLACE 1 DROP INTO BOTH EYES EVERY 4 (FOUR) HOURS AS NEEDED  . VENTOLIN HFA 108 (90 Base) MCG/ACT inhaler INHALE 2 PUFFS INTO THE LUNGS EVERY 6 (SIX) HOURS AS  NEEDED FOR WHEEZING OR SHORTNESS OF BREATH.  Marland Kitchen Vitamin D, Cholecalciferol, 1000 units TABS Take 1,000 Units by mouth daily.   No facility-administered encounter medications on file as of 01/28/2016.     Functional Status:  In your present state of health, do you have any difficulty performing the following activities: 12/09/2015 11/17/2015  Hearing? N N  Vision? N Y  Difficulty concentrating or making decisions? N N  Walking or climbing stairs? Y N  Dressing or bathing? N N  Doing errands, shopping? N N  Preparing Food and eating ? N N  Using the Toilet? N N  In the past six months, have you accidently leaked urine? N N  Do you have problems with loss of bowel control? N N  Managing your Medications? N N  Managing your Finances? N N  Housekeeping or managing your Housekeeping? N N  Some recent data might be hidden    Fall/Depression Screening:  PHQ 2/9 Scores 12/23/2015 12/09/2015 11/26/2015 11/17/2015 11/13/2015 10/15/2015 09/19/2015  PHQ - 2 Score 0 0 0 0 0 0 0  PHQ- 9 Score - - - - - - -    Assessment:   CSW traveled to client home on 01/28/16 and met with client at home of client  on 01/28/16. Client sees Dr. Wendi Snipes as primary doctor.  She said she has appointment  with Dr. Wendi Snipes on 01/29/16.  Client said she has difficulty raising her right arm.  She  said she has prescribed medications and is taking medications as prescribed.  She said she receives support from [employer] 5 days weekly (lunch meal delivery 5 days weekly).  She said her son is supportive and helps transport her to and from her medical appointments.  She has had no recent falls.  She reports pain in right shoulder. She plans to talk with Dr. Wendi Snipes at appointment  tomorrow about her right shoulder pain. She said she has previoulsy received hydrocortisone injection in her right shoulder to help with pain management. She has received hydrocortisone injection two times over past year.  CSW and client spoke of  client's financial challenges.  CSW was able to obtain two [employer] gift cards ($ 25.00 on each card).  CSW gave client these [place] gift cards on 01/28/16. CSW and client spoke of client care plan. CSW encouraged client to communicate with CSW in next 30 days to discuss community resources for food support for client.  Client said she has had skin reaction to lead in her system. She has osteoporosis. Client walks well and does well with activities of daily living.  She has Medicare and Medicaid.  She said she uses nebulizer as prescribed.  She said she has inhaler she uses as prescribed.  She said she has limited number of friends. She said she tries to be as independent as possible but does have financial challenges.  She said she eats 6 small meals daily and is watching her sugar level.  She said she is not currenlty on insulin. She said she has had low sugar levels before and wears a bracelet for hypoglycemia with contact number for help for her on this bracelet.  She said she would be interested in [place] visiting her at her home to assess nursing needs of client. CSW gave client [facility] CSW card. CSW encouraged client to call CSW at [phone] as needed to discuss social work needs of client.  Plan:   Client to communicate with CSW in next 30 days to discuss community resources for food procurement for client.  CSW to call client as scheduled to discuss client needs.  Norva Riffle.Maureena Dabbs MSW, LCSW Licensed Clinical Social Worker [facility] Care Management [phone]

## 2016-01-29 ENCOUNTER — Ambulatory Visit (INDEPENDENT_AMBULATORY_CARE_PROVIDER_SITE_OTHER): Payer: Medicare Other | Admitting: Family Medicine

## 2016-01-29 VITALS — BP 111/67 | HR 67 | Temp 96.7°F | Ht 65.0 in | Wt 137.2 lb

## 2016-01-29 DIAGNOSIS — M25511 Pain in right shoulder: Secondary | ICD-10-CM

## 2016-01-29 DIAGNOSIS — R300 Dysuria: Secondary | ICD-10-CM | POA: Diagnosis not present

## 2016-01-29 DIAGNOSIS — J449 Chronic obstructive pulmonary disease, unspecified: Secondary | ICD-10-CM | POA: Diagnosis not present

## 2016-01-29 DIAGNOSIS — N3000 Acute cystitis without hematuria: Secondary | ICD-10-CM | POA: Diagnosis not present

## 2016-01-29 DIAGNOSIS — G8929 Other chronic pain: Secondary | ICD-10-CM

## 2016-01-29 DIAGNOSIS — F411 Generalized anxiety disorder: Secondary | ICD-10-CM | POA: Diagnosis not present

## 2016-01-29 LAB — URINALYSIS, COMPLETE
BILIRUBIN UA: NEGATIVE
GLUCOSE, UA: NEGATIVE
KETONES UA: NEGATIVE
NITRITE UA: NEGATIVE
Protein, UA: NEGATIVE
RBC UA: NEGATIVE
SPEC GRAV UA: 1.01 (ref 1.005–1.030)
UUROB: 0.2 mg/dL (ref 0.2–1.0)
pH, UA: 7 (ref 5.0–7.5)

## 2016-01-29 LAB — MICROSCOPIC EXAMINATION: Renal Epithel, UA: NONE SEEN /hpf

## 2016-01-29 MED ORDER — TRAMADOL HCL 50 MG PO TABS: 50.0000 mg | ORAL_TABLET | Freq: Three times a day (TID) | ORAL | 2 refills | Status: DC | PRN

## 2016-01-29 MED ORDER — CEFDINIR 300 MG PO CAPS: 300.0000 mg | ORAL_CAPSULE | Freq: Two times a day (BID) | ORAL | 0 refills | Status: DC

## 2016-01-29 NOTE — Progress Notes (Signed)
   HPI  Patient presents today here with concern for UTI for follow-up of anxiety and shoulder pain.  UTI symptoms Patient reports dysuria for 4-5 days, increased urinary frequency, foul-smelling urine, suprapubic tenderness. She denies fever, chills, sweats, or difficulty tolerating foods or fluids by mouth.  She's also had an increased cough over the last 4-5 days, she states this is routine with the weather but is very annoyed with the cough. Albuterol is helping with the cough.  Anxiety Improving with Cymbalta, patient is only using Ativan rarely now, she is sized one episode of using Ativan a few days ago in her refrigerator broke and she lost all of her food.  Right shoulder pain Previously helped great deal by steroid injection, she states that she's been told she needs a right shoulder replacement but can't because of osteoporosis Tramadol helps the pain. She needs a refill, she is agreeable to reviewing the pain contract next visit.   PMH: Smoking status noted ROS: Per HPI  Objective: BP 111/67   Pulse 67   Temp (!) 96.7 F (35.9 C) (Oral)   Ht 5\' 5"  (1.651 m)   Wt 137 lb 3.2 oz (62.2 kg)   BMI 22.83 kg/m  Gen: NAD, alert, cooperative with exam HEENT: NCAT, EOMI, PERRL CV: RRR, good S1/S2, no murmur Resp: CTABL, no wheezes, non-labored ABD: Tenderness in the suprapubic area Ext: No edema, warm Neuro: Alert and oriented, No gross deficits Wrist brace in place bilaterally  Assessment and plan:  # Acute cystitis without hematuria Treat with Omnicef, this would cover her for a COPD exacerbation as well Her lung exam is very reassuring, good air movement and nonlabored on exam No signs of urosepsis  # Generalized anxiety disorder Improving, only occasional benzodiazepine use as needed. Continue Cymbalta  # Chronic right shoulder pain Discussed caution with repetitive use of corticosteroids and osteoporosis, patient requesting another injection today Differ  injection for now, especially with active UTI. Tramadol refilled, will need to put her and pain/controlled substance contract on an upcoming visit- she is agreeable   Orders Placed This Encounter  Procedures  . Urinalysis, Complete    Meds ordered this encounter  Medications  . traMADol (ULTRAM) 50 MG tablet    Sig: Take 1 tablet (50 mg total) by mouth every 8 (eight) hours as needed.    Dispense:  30 tablet    Refill:  2    Not to exceed 5 additional fills before 11/15/2015.  . cefdinir (OMNICEF) 300 MG capsule    Sig: Take 1 capsule (300 mg total) by mouth 2 (two) times daily. 1 po BID    Dispense:  20 capsule    Refill:  0    Murtis SinkSam Shandee Jergens, MD [facility] 01/29/2016, 9:23 AM

## 2016-01-29 NOTE — Patient Instructions (Addendum)
Great to see you!  Lets follow up in 3 months (february), and we will do your annual labs then.    Urinary Tract Infection, Adult A urinary tract infection (UTI) is an infection of any part of the urinary tract, which includes the kidneys, ureters, bladder, and urethra. These organs make, store, and get rid of urine in the body. UTI can be a bladder infection (cystitis) or kidney infection (pyelonephritis). What are the causes? This infection may be caused by fungi, viruses, or bacteria. Bacteria are the most common cause of UTIs. This condition can also be caused by repeated incomplete emptying of the bladder during urination. What increases the risk? This condition is more likely to develop if:  You ignore your need to urinate or hold urine for long periods of time.  You do not empty your bladder completely during urination.  You wipe back to front after urinating or having a bowel movement, if you are female.  You are uncircumcised, if you are female.  You are constipated.  You have a urinary catheter that stays in place (indwelling).  You have a weak defense (immune) system.  You have a medical condition that affects your bowels, kidneys, or bladder.  You have diabetes.  You take antibiotic medicines frequently or for long periods of time, and the antibiotics no longer work well against certain types of infections (antibiotic resistance).  You take medicines that irritate your urinary tract.  You are exposed to chemicals that irritate your urinary tract.  You are female. What are the signs or symptoms? Symptoms of this condition include:  Fever.  Frequent urination or passing small amounts of urine frequently.  Needing to urinate urgently.  Pain or burning with urination.  Urine that smells bad or unusual.  Cloudy urine.  Pain in the lower abdomen or back.  Trouble urinating.  Blood in the urine.  Vomiting or being less hungry than normal.  Diarrhea or  abdominal pain.  Vaginal discharge, if you are female. How is this diagnosed? This condition is diagnosed with a medical history and physical exam. You will also need to provide a urine sample to test your urine. Other tests may be done, including:  Blood tests.  Sexually transmitted disease (STD) testing. If you have had more than one UTI, a cystoscopy or imaging studies may be done to determine the cause of the infections. How is this treated? Treatment for this condition often includes a combination of two or more of the following:  Antibiotic medicine.  Other medicines to treat less common causes of UTI.  Over-the-counter medicines to treat pain.  Drinking enough water to stay hydrated. Follow these instructions at home:  Take over-the-counter and prescription medicines only as told by your health care provider.  If you were prescribed an antibiotic, take it as told by your health care provider. Do not stop taking the antibiotic even if you start to feel better.  Avoid alcohol, caffeine, tea, and carbonated beverages. They can irritate your bladder.  Drink enough fluid to keep your urine clear or pale yellow.  Keep all follow-up visits as told by your health care provider. This is important.  Make sure to:  Empty your bladder often and completely. Do not hold urine for long periods of time.  Empty your bladder before and after sex.  Wipe from front to back after a bowel movement if you are female. Use each tissue one time when you wipe. Contact a health care provider if:  You  have back pain.  You have a fever.  You feel nauseous or vomit.  Your symptoms do not get better after 3 days.  Your symptoms go away and then return. Get help right away if:  You have severe back pain or lower abdominal pain.  You are vomiting and cannot keep down any medicines or water. This information is not intended to replace advice given to you by your health care provider. Make  sure you discuss any questions you have with your health care provider. Document Released: 11/25/2004 Document Revised: 07/30/2015 Document Reviewed: 01/06/2015 Elsevier Interactive Patient Education  2017 [employer]

## 2016-02-04 ENCOUNTER — Other Ambulatory Visit: Payer: Self-pay | Admitting: Licensed Clinical Social Worker

## 2016-02-04 ENCOUNTER — Other Ambulatory Visit: Payer: Self-pay | Admitting: *Deleted

## 2016-02-04 NOTE — Patient Outreach (Signed)
Assessment:  CSW spoke via phone with client. CSW verified client identity. CSW received verbal permission from client on 02/04/16 for CSW to speak with client about current client needs. Client lives alone at her home.  She said she has her prescribed medications and is taking medications as prescribed. She is trying to attend all scheduled client medical appointments.  Client sees Dr. Ermalinda MemosBradshaw as primary care doctor. She said she receives Meals on Wheels lunch meals delivery 5 days weekly, as scheduled. She said her son is supportive. She said that her son transports her to and from her scheduled medical appointments.  CSW and client spoke of client care plan. CSW encouraged client to communicate with CSW in next 30 days to discuss community resources for food support for client. Client has Medicare and Medicaid. She said her son periodically takes her to local food pantry in order for client to receive some food assistance.  Client said she had appointment with Dr. Ermalinda MemosBradshaw last week and that he had prescribed antibiotics for her related to a kidney infection of client.  She said she was eating adequately.  Client said she is scheduled to take prescribed antibiotic for about 5 more days.  Client said she has numerous medications prescribed and is trying to take medications as prescribed. She said she still has pain in her right shoulder. She talked with Dr. Ermalinda MemosBradshaw about her right shoulder pain at recent appointment of client with Dr. Ermalinda MemosBradshaw.  Client said when she is feeling better she hopes to go with her son into the community to complete needed errands and try to obtain other food items needs for client. CSW encouraged Lindsey May to call CSW at [phone] as needed to discuss social work needs of client. Lindsey May was appreciative of phone call from CSW on 02/04/16.   Plan:  Client to communicate with CSW in next 30 days to discuss community resources for food support for client.  CSW to call client in 4  weeks to assess client needs.    Kelton PillarMichael S.Tahjae Clausing MSW, LCSW Licensed Clinical Social Worker [facility] Care [facility] [phone]

## 2016-02-04 NOTE — Patient Outreach (Signed)
[facility] from [facility], telephone call to pt to assess for nursing needs and schedule home visit if needed, spoke with pt, HIPAA verified, pt states she is managing her COPD well, states she is very careful about avoiding sick persons, being "careful what I breath in",  Pt states she has all medications and taking as prescribed, pt states she eats 6 small meals per day for hypoglycemia and keeps hard candy on hand and works with her MD closely.  Pt states she does not drive and her son assists her with transportation.  Pt states she attends MD appointments as scheduled.  Pt reports she has no needs for a home visit and states no needs to be managed telephonically as she did not work with telephonic case manager previously when they reached out to her.  RN CM sent In basket to CSW Lorna FewScott Forrest informing pt does not have any needs for nursing services at this time.  Lindsey ShowsJulie May [facility], BSN [facility] Community Care Coordinator [phone]

## 2016-02-17 ENCOUNTER — Other Ambulatory Visit: Payer: Self-pay | Admitting: Family Medicine

## 2016-02-18 NOTE — Telephone Encounter (Signed)
Dr Ermalinda MemosBradshaw will be back 02/19/16

## 2016-02-19 NOTE — Telephone Encounter (Signed)
Rx called into pharmacy and patient advised she must be seen for any further refills

## 2016-03-09 ENCOUNTER — Other Ambulatory Visit: Payer: Self-pay | Admitting: Licensed Clinical Social Worker

## 2016-03-09 NOTE — Patient Outreach (Signed)
Assessment:  CSW spoke via phone with client. CSW verified client identity. CSW received verbal permission from client on 03/09/16 for CSW to speak with client about current clinet needs. Client sees Dr. Ermalinda MemosBradshaw as primary care doctor. She said she has appoinment scheduled with Dr. Ermalinda MemosBradshaw on 03/29/16.  She said she had her prescribed medications and is taking medications as prescribed. Client receives [employer] on Whe[employer] lunch meal delivery 5 days weekly as scheduled.  She stated that her son transports client to and from client's scheduled medical appointments. CSW and client spoke of client care plan.  CSW encouraged client to communicate with CSW in next 30 days to discuss community resources for food assistance for client. Client has Medicare and Medicaid. She said her son occasionally takes her to local food pantry for food support/assistance. [facility]. Lindsey ShowsJulie Farmer, RN, called client and talked with client about [facility]. [facility]; [facility].  Client said she is taking nebulizer treatments daily as scheduled. She takes prescribed pain medication.  She said she has some skin issues related to past exposure to lead.  She said she has been taking medications as prescribed. CSW informed client that she could call CSW at [phone] as needed to discuss social work needs of client. Client did receive some home items needed recently via mail. These items were paid for through the [facility] [place] [facility]. Client was appreciative of items she received via mail paid for by [facility] Giving [facility].    Plan:  Client to communicate with CSW in next 30 days to discuss community resources for food assistance for client.   CSW to call client in 4 weeks to assess client needs at that time.  Lindsey May MSW, LCSW Licensed Clinical Social Worker [facility] Care [facility] [phone]

## 2016-03-24 ENCOUNTER — Encounter: Payer: Self-pay | Admitting: Gastroenterology

## 2016-03-24 ENCOUNTER — Other Ambulatory Visit: Payer: Self-pay | Admitting: Family Medicine

## 2016-03-31 ENCOUNTER — Encounter: Payer: Self-pay | Admitting: Family Medicine

## 2016-03-31 ENCOUNTER — Ambulatory Visit (INDEPENDENT_AMBULATORY_CARE_PROVIDER_SITE_OTHER): Payer: Medicare Other | Admitting: Family Medicine

## 2016-03-31 VITALS — BP 139/89 | HR 73 | Temp 96.0°F | Ht 65.0 in | Wt 142.0 lb

## 2016-03-31 DIAGNOSIS — F411 Generalized anxiety disorder: Secondary | ICD-10-CM

## 2016-03-31 DIAGNOSIS — N3000 Acute cystitis without hematuria: Secondary | ICD-10-CM

## 2016-03-31 DIAGNOSIS — J0100 Acute maxillary sinusitis, unspecified: Secondary | ICD-10-CM | POA: Diagnosis not present

## 2016-03-31 DIAGNOSIS — R300 Dysuria: Secondary | ICD-10-CM | POA: Diagnosis not present

## 2016-03-31 LAB — URINALYSIS, COMPLETE
Bilirubin, UA: NEGATIVE
GLUCOSE, UA: NEGATIVE
Ketones, UA: NEGATIVE
Nitrite, UA: NEGATIVE
PH UA: 7 (ref 5.0–7.5)
PROTEIN UA: NEGATIVE
RBC, UA: NEGATIVE
Specific Gravity, UA: 1.01 (ref 1.005–1.030)
Urobilinogen, Ur: 0.2 mg/dL (ref 0.2–1.0)

## 2016-03-31 LAB — MICROSCOPIC EXAMINATION: RBC, UA: NONE SEEN /hpf (ref 0–?)

## 2016-03-31 MED ORDER — CEFDINIR 300 MG PO CAPS: 300.0000 mg | ORAL_CAPSULE | Freq: Two times a day (BID) | ORAL | 0 refills | Status: DC

## 2016-03-31 MED ORDER — LORAZEPAM 0.5 MG PO TABS: 0.5000 mg | ORAL_TABLET | Freq: Three times a day (TID) | ORAL | 0 refills | Status: DC | PRN

## 2016-03-31 NOTE — Progress Notes (Signed)
   HPI  Patient presents today for follow-up for chronic medical conditions as well as dysuria and facial pain.  Anxiety Exacerbated recently with the court case. Requests refill of Ativan. She's taking it only as needed 1 or 2 times daily  Facial pain One week of bilateral facial pain with nasal congestion. Denies any sore throat, chest pain, or dyspnea. Previous cough is resolving.  Dysuria and right-sided low back pain has been going on for 7-10 days No hematuria. Recently did very well on Omnicef   PMH: Smoking status noted ROS: Per HPI  Objective: BP 139/89   Pulse 73   Temp (!) 96 F (35.6 C) (Oral)   Ht 5\' 5"  (1.651 m)   Wt 142 lb (64.4 kg)   BMI 23.63 kg/m  Gen: NAD, alert, cooperative with exam HEENT: NCAT CV: RRR, good S1/S2, no murmur Resp: CTABL, no wheezes, non-labored Ext: No edema, warm Neuro: Alert and oriented, No gross deficits  Assessment and plan:  # Generalized anxiety disorder Refilled Ativan, continue Cymbalta. Patient with current exacerbation with a court case.   # Acute sinusitis Treat with Omnicef, had good improvement over 2 months ago with this.  # Acute maxillary sinusitis Covering with Omnicef Discussed supportive care Low threshold for follow-up of symptoms change or do not improve as expected. This is her second course of Omnicef, could consider changing to doxycycline or Levaquin however Omnicef should have very good urinary coverage as well.      Orders Placed This Encounter  Procedures  . Urinalysis, Complete    Meds ordered this encounter  Medications  . cefdinir (OMNICEF) 300 MG capsule    Sig: Take 1 capsule (300 mg total) by mouth 2 (two) times daily. 1 po BID    Dispense:  20 capsule    Refill:  0    Murtis SinkSam Bradshaw, MD [facility] 03/31/2016, 10:51 AM

## 2016-03-31 NOTE — Patient Instructions (Signed)
Great to see you!   Sinusitis, Adult Sinusitis is soreness and inflammation of your sinuses. Sinuses are hollow spaces in the bones around your face. They are located:  Around your eyes.  In the middle of your forehead.  Behind your nose.  In your cheekbones. Your sinuses and nasal passages are lined with a stringy fluid (mucus). Mucus normally drains out of your sinuses. When your nasal tissues get inflamed or swollen, the mucus can get trapped or blocked so air cannot flow through your sinuses. This lets bacteria, viruses, and funguses grow, and that leads to infection. Follow these instructions at home: Medicines   Take, use, or apply over-the-counter and prescription medicines only as told by your doctor. These may include nasal sprays.  If you were prescribed an antibiotic medicine, take it as told by your doctor. Do not stop taking the antibiotic even if you start to feel better. Hydrate and Humidify   Drink enough water to keep your pee (urine) clear or pale yellow.  Use a cool mist humidifier to keep the humidity level in your home above 50%.  Breathe in steam for 10-15 minutes, 3-4 times a day or as told by your doctor. You can do this in the bathroom while a hot shower is running.  Try not to spend time in cool or dry air. Rest   Rest as much as possible.  Sleep with your head raised (elevated).  Make sure to get enough sleep each night. General instructions   Put a warm, moist washcloth on your face 3-4 times a day or as told by your doctor. This will help with discomfort.  Wash your hands often with soap and water. If there is no soap and water, use hand sanitizer.  Do not smoke. Avoid being around people who are smoking (secondhand smoke).  Keep all follow-up visits as told by your doctor. This is important. Contact a doctor if:  You have a fever.  Your symptoms get worse.  Your symptoms do not get better within 10 days. Get help right away if:  You  have a very bad headache.  You cannot stop throwing up (vomiting).  You have pain or swelling around your face or eyes.  You have trouble seeing.  You feel confused.  Your neck is stiff.  You have trouble breathing. This information is not intended to replace advice given to you by your health care provider. Make sure you discuss any questions you have with your health care provider. Document Released: 08/04/2007 Document Revised: 10/12/2015 Document Reviewed: 12/11/2014 Elsevier Interactive Patient Education  2017 Elsevier Inc.  

## 2016-04-05 ENCOUNTER — Other Ambulatory Visit: Payer: Self-pay | Admitting: Family Medicine

## 2016-04-05 LAB — URINE CULTURE

## 2016-04-05 MED ORDER — MECLIZINE HCL 25 MG PO TABS: ORAL_TABLET | ORAL | 0 refills | Status: DC

## 2016-04-05 NOTE — Telephone Encounter (Signed)
Rx sent.   Murtis SinkSam Ashwika Freels, MD [facility] 04/05/2016, 12:59 PM

## 2016-04-05 NOTE — Telephone Encounter (Signed)
Pt aware Rx sent in & of recommendations

## 2016-04-06 ENCOUNTER — Other Ambulatory Visit: Payer: Self-pay | Admitting: Licensed Clinical Social Worker

## 2016-04-06 NOTE — Patient Outreach (Signed)
Assessment:  CSW spoke via phone with client. CSW verified client identity. CSW received verbal permission from client on 04/06/16 for CSW to speak with client about current client needs and status.  Client said she had her prescribed medications and was taking medications as prescribed. Client sees Dr. Ermalinda MemosBradshaw as primary care doctor. Client receives Meals on Wheels lunch meals delivery 5 days weekly as scheduled. Client said that her son transports client to and from client's scheduled medical appointments. CSW and client spoke of client care plan. CSW encouraged client to communicate with CSW in next 30 days to discuss community resources for food assistance for client. Client has Medicare and Medicaid. She said she occasionally goes to local food pantry to seek food support/assistance. CSW encouraged Shaylon to go to local food pantry to seek food support/asssitance for client. Client said she would try to get her son to drive her to local food pantry to seek food support for client. Client said she is taking nebulizer treatments daily as prescribed.  Client said she is taking a pain medication as prescribed. Client said she is trying to attend all scheduled client medical appointments. Client said she was trying to save money to buy a pair of support stockings to help her manage swelling in her legs. Client said she had an appointment with Dr. Ermalinda MemosBradshaw on 03/31/16.  Client and CSW spoke of financial challenges of client. CSW encouraged client to call CSW at [phone] as needed to discuss social work needs of client. Client was appreciative of call from CSW on 04/06/16.     Plan:  Client to communicate with CSW in next 30 days to discuss community resources for food assistance for client.  CSW to call client in 4 weeks to assess client needs at that time.  Kelton PillarMichael S.Tejuan Gholson MSW, LCSW Licensed Clinical Social Worker [facility] Care Management [phone]

## 2016-04-19 ENCOUNTER — Other Ambulatory Visit: Payer: Self-pay | Admitting: Family Medicine

## 2016-04-24 ENCOUNTER — Other Ambulatory Visit: Payer: Self-pay | Admitting: Family Medicine

## 2016-04-26 ENCOUNTER — Other Ambulatory Visit: Payer: Self-pay | Admitting: Family Medicine

## 2016-05-04 ENCOUNTER — Telehealth: Payer: Self-pay | Admitting: Family Medicine

## 2016-05-04 NOTE — Telephone Encounter (Signed)
What is the name of the medication? Lorazepam 0.5 mg, Meclizine 25 mg and Tramadol  Have you contacted your pharmacy to request a refill? YES and the pharmacy to asked for her medication  Which [facility] [place]   Patient notified that their request is being sent to the clinical staff for review and that they should receive a call once it is complete. If they do not receive a call within 24 hours they can check with their pharmacy or our office.

## 2016-05-06 ENCOUNTER — Other Ambulatory Visit: Payer: Self-pay | Admitting: Family Medicine

## 2016-05-06 MED ORDER — MECLIZINE HCL 25 MG PO TABS: ORAL_TABLET | ORAL | 0 refills | Status: DC

## 2016-05-06 NOTE — Telephone Encounter (Signed)
Spoke to pt and she understands she can't get the Tramadol but wants to know if she can get the refill on Lorazepam and Meclizine.

## 2016-05-06 NOTE — Telephone Encounter (Signed)
Has to be seen for controlled substance refills.   Meclizine sent.   Murtis SinkSam Fields Oros, MD [facility] 05/06/2016, 5:27 PM

## 2016-05-07 ENCOUNTER — Other Ambulatory Visit: Payer: Self-pay | Admitting: Licensed Clinical Social Worker

## 2016-05-07 NOTE — Patient Outreach (Signed)
Assessment:  CSW spoke via phone with client. CSW verified client identity. CSW received verbal permission from client on 05/07/16 for CSW to speak with client about current client  needs and status.  Client said she had her prescribed medications and is taking medications as prescribed. She sees Dr. Ermalinda MemosBradshaw as her primary care doctor. She has appointment scheduled with Dr. Ermalinda MemosBradshaw for 05/10/16.  She is trying to attend scheduled client medical appointments. Client has some support from her son. Her son helps in transporting client to and from client's scheduled medical appointments. Client receives Meals on Wheels lunch meals delivery 5 days per week. CSW and client spoke of client care plan. CSW encouraged client to communicate with CSW in next 30 days to discuss community resources for food assistance for client. Client is taking a pain medication as prescribed. She has Medicare and Medicaid. She said she takes nebulizer treatments as prescribed. Client said she had gone to local clothes closet recently and received some clothes items needed.  CSW encouraged client to go to local food pantry, if able, to seek food assistance.  Client said she has financial struggles and has difficulty paying her monthly bills due. CSW encouraged client to call CSW at [phone] as needed to discuss social work needs of client. Client was appreciative of call from CSW on 05/07/16.   Plan:  Client to communicate with CSW in next 30 days to discuss community resources for food assistance for client.  CSW to call client in 4 weeks to assess client needs at that time.   Kelton PillarMichael S.Diarra Ceja MSW, LCSW Licensed Clinical Social Worker [facility] Care Management [phone]

## 2016-05-07 NOTE — Telephone Encounter (Signed)
Patient aware that Meclizine was sent to pharmacy, appointment made for 05/10/16 at 7:55 am for Lorazepam refill.

## 2016-05-10 ENCOUNTER — Ambulatory Visit (INDEPENDENT_AMBULATORY_CARE_PROVIDER_SITE_OTHER): Payer: Medicare Other | Admitting: Family Medicine

## 2016-05-10 ENCOUNTER — Encounter: Payer: Self-pay | Admitting: Family Medicine

## 2016-05-10 VITALS — BP 103/66 | HR 80 | Temp 97.3°F | Ht 65.0 in | Wt 139.6 lb

## 2016-05-10 DIAGNOSIS — J0100 Acute maxillary sinusitis, unspecified: Secondary | ICD-10-CM | POA: Diagnosis not present

## 2016-05-10 DIAGNOSIS — J449 Chronic obstructive pulmonary disease, unspecified: Secondary | ICD-10-CM

## 2016-05-10 DIAGNOSIS — F411 Generalized anxiety disorder: Secondary | ICD-10-CM | POA: Diagnosis not present

## 2016-05-10 MED ORDER — LORAZEPAM 0.5 MG PO TABS: 0.5000 mg | ORAL_TABLET | Freq: Three times a day (TID) | ORAL | 2 refills | Status: DC | PRN

## 2016-05-10 MED ORDER — DOXYCYCLINE HYCLATE 100 MG PO TABS: 100.0000 mg | ORAL_TABLET | Freq: Two times a day (BID) | ORAL | 0 refills | Status: DC

## 2016-05-10 NOTE — Patient Instructions (Signed)
Great to see you!  Come back in 3 mpnths unless you need us sooner.   Doxycyline is a antibiotic for your sinues.    Sinusitis, Adult Sinusitis is soreness and inflammation of your sinuses. Sinuses are hollow spaces in the bones around your face. They are located:  Around your eyes.  In the middle of your forehead.  Behind your nose.  In your cheekbones. Your sinuses and nasal passages are lined with a stringy fluid (mucus). Mucus normally drains out of your sinuses. When your nasal tissues get inflamed or swollen, the mucus can get trapped or blocked so air cannot flow through your sinuses. This lets bacteria, viruses, and funguses grow, and that leads to infection. Follow these instructions at home: Medicines   Take, use, or apply over-the-counter and prescription medicines only as told by your doctor. These may include nasal sprays.  If you were prescribed an antibiotic medicine, take it as told by your doctor. Do not stop taking the antibiotic even if you start to feel better. Hydrate and Humidify   Drink enough water to keep your pee (urine) clear or pale yellow.  Use a cool mist humidifier to keep the humidity level in your home above 50%.  Breathe in steam for 10-15 minutes, 3-4 times a day or as told by your doctor. You can do this in the bathroom while a hot shower is running.  Try not to spend time in cool or dry air. Rest   Rest as much as possible.  Sleep with your head raised (elevated).  Make sure to get enough sleep each night. General instructions   Put a warm, moist washcloth on your face 3-4 times a day or as told by your doctor. This will help with discomfort.  Wash your hands often with soap and water. If there is no soap and water, use hand sanitizer.  Do not smoke. Avoid being around people who are smoking (secondhand smoke).  Keep all follow-up visits as told by your doctor. This is important. Contact a doctor if:  You have a fever.  Your  symptoms get worse.  Your symptoms do not get better within 10 days. Get help right away if:  You have a very bad headache.  You cannot stop throwing up (vomiting).  You have pain or swelling around your face or eyes.  You have trouble seeing.  You feel confused.  Your neck is stiff.  You have trouble breathing. This information is not intended to replace advice given to you by your health care provider. Make sure you discuss any questions you have with your health care provider. Document Released: 08/04/2007 Document Revised: 10/12/2015 Document Reviewed: 12/11/2014 Elsevier Interactive Patient Education  2017 [employer]

## 2016-05-10 NOTE — Progress Notes (Signed)
   HPI  Patient presents today here with concern for acute sinusitis and to request refills.  Patient would like refill on Ativan and tramadol. When I discussed using only incident as a pains or narcotics not being able to use both chronically she states that she will use Tylenol for pain and requests Ativan. In general her anxiety is a little bit worse than usual with a recent court case.  Sinusitis Facial pain and tenderness with nasal discharge and cough for about 3 days. Patient states that she is breathing well and her nebulizer is working well. Denies any chest pain.  PMH: Smoking status noted ROS: Per HPI  Objective: BP 103/66   Pulse 80   Temp 97.3 F (36.3 C) (Oral)   Ht 5\' 5"  (1.651 m)   Wt 139 lb 9.6 oz (63.3 kg)   BMI 23.23 kg/m  Gen: NAD, alert, cooperative with exam HEENT: NCAT, tenderness to palpation bilateral maxillary sinuses, oropharynx moist and clear CV: RRR, good S1/S2, no murmur Resp: CTABL, no wheezes, non-labored Ext: No edema, warm Neuro: Alert and oriented, No gross deficits  Assessment and plan:  # Acute sinusitis Treat with doxycycline, patient was treated with Omnicef about 5 weeks ago. This was for COPD exacerbation. Her breathing is at baseline today, however she does have increased cough.   # Generalized anxiety disorder Exacerbated due to her current court case, refill Ativan Continue Cymbalta Discussed avoiding treating with benzodiazepines plus narcotics, patient states that she does not need to take tramadol. Discontinue tramadol.  COPD Breathing at baseline but with some increased cough Doing well with nebs Doxy as above, no need for prednisone at this time.  Limit sedating meds with COPD     Meds ordered this encounter  Medications  . doxycycline (VIBRA-TABS) 100 MG tablet    Sig: Take 1 tablet (100 mg total) by mouth 2 (two) times daily. 1 po bid    Dispense:  20 tablet    Refill:  0  . LORazepam (ATIVAN) 0.5 MG  tablet    Sig: Take 1 tablet (0.5 mg total) by mouth every 8 (eight) hours as needed.    Dispense:  60 tablet    Refill:  2    Not to exceed 2 additional fills before 04/12/2016.    Murtis SinkSam Shanta Dorvil, MD [facility] 05/10/2016, 12:19 PM

## 2016-05-12 ENCOUNTER — Telehealth: Payer: Self-pay | Admitting: Pharmacist

## 2016-05-12 NOTE — Telephone Encounter (Signed)
Called to let you know how she is doing

## 2016-05-12 NOTE — Telephone Encounter (Signed)
Patient just wanted me to know that she was feeling better since started ABX 2 days ago.  She also is tolerating risedronate for osteoporosis.

## 2016-05-23 ENCOUNTER — Other Ambulatory Visit: Payer: Self-pay | Admitting: Family Medicine

## 2016-06-01 ENCOUNTER — Other Ambulatory Visit: Payer: Self-pay | Admitting: Family Medicine

## 2016-06-01 DIAGNOSIS — J208 Acute bronchitis due to other specified organisms: Secondary | ICD-10-CM

## 2016-06-08 ENCOUNTER — Encounter: Payer: Self-pay | Admitting: Family

## 2016-06-08 ENCOUNTER — Ambulatory Visit (INDEPENDENT_AMBULATORY_CARE_PROVIDER_SITE_OTHER): Payer: Medicare Other | Admitting: Family

## 2016-06-08 VITALS — BP 123/66 | HR 77 | Temp 96.6°F | Ht 65.0 in | Wt 141.2 lb

## 2016-06-08 DIAGNOSIS — R109 Unspecified abdominal pain: Secondary | ICD-10-CM

## 2016-06-08 DIAGNOSIS — N390 Urinary tract infection, site not specified: Secondary | ICD-10-CM

## 2016-06-08 MED ORDER — SULFAMETHOXAZOLE-TRIMETHOPRIM 800-160 MG PO TABS: 1.0000 | ORAL_TABLET | Freq: Two times a day (BID) | ORAL | 0 refills | Status: DC

## 2016-06-08 NOTE — Patient Instructions (Signed)

## 2016-06-08 NOTE — Progress Notes (Signed)
   Subjective:    Patient ID: Lindsey May, female    DOB: [birth year], 66 y.o.   MRN: [id number]  Flank Pain  This is a new problem. The current episode started in the past 7 days. The problem occurs intermittently. The problem has been gradually worsening since onset. The pain is moderate. Associated symptoms include dysuria. Pertinent negatives include no fever. She has tried bed rest for the symptoms. The treatment provided mild relief.  Dysuria   The current episode started in the past 7 days. The problem occurs intermittently. The problem has been waxing and waning. The quality of the pain is described as burning. Associated symptoms include flank pain, frequency, hesitancy and urgency. She has tried increased fluids for the symptoms. The treatment provided mild relief.      Review of Systems  Constitutional: Negative for fever.  Genitourinary: Positive for dysuria, flank pain, frequency, hesitancy and urgency.  All other systems reviewed and are negative.      Objective:   Physical Exam  Constitutional: She is oriented to person, place, and time. She appears well-developed and well-nourished. No distress.  HENT:  Head: Normocephalic.  Eyes: Pupils are equal, round, and reactive to light.  Cardiovascular: Normal rate, regular rhythm, normal heart sounds and intact distal pulses.   No murmur heard. Pulmonary/Chest: Effort normal and breath sounds normal. No respiratory distress. She has no wheezes.  Abdominal: Soft. Bowel sounds are normal. She exhibits no distension. There is tenderness (mild lower abdomen tenderness).  Musculoskeletal: Normal range of motion. She exhibits tenderness (trace BLE). She exhibits no edema.  Mild CVA tenderness   Neurological: She is alert and oriented to person, place, and time.  Skin: Skin is warm and dry.  Psychiatric: She has a normal mood and affect. Her behavior is normal. Judgment and thought content normal.  Vitals reviewed.     BP  123/66   Pulse 77   Temp (!) 96.6 F (35.9 C) (Oral)   Ht  (1.651 m)   Wt 141 lb 3.2 oz (64 kg)   BMI 23.50 kg/m      Assessment & Plan:  1. Flank pain - Urinalysis, Complete  2. Urinary tract infection without hematuria, site unspecified Force fluids AZO over the counter X2 days RTO prn Culture pending - sulfamethoxazole-trimethoprim (BACTRIM DS) 800-160 MG tablet; Take 1 tablet by mouth 2 (two) times daily.  Dispense: 14 tablet; Refill: 0 - Urine culture   Jannifer Rodney, FNP

## 2016-06-09 ENCOUNTER — Other Ambulatory Visit: Payer: Medicare Other

## 2016-06-09 ENCOUNTER — Other Ambulatory Visit: Payer: Self-pay | Admitting: Family Medicine

## 2016-06-09 DIAGNOSIS — R109 Unspecified abdominal pain: Secondary | ICD-10-CM | POA: Diagnosis not present

## 2016-06-09 LAB — URINALYSIS, COMPLETE
BILIRUBIN UA: NEGATIVE
Glucose, UA: NEGATIVE
Ketones, UA: NEGATIVE
LEUKOCYTES UA: NEGATIVE
Nitrite, UA: NEGATIVE
PH UA: 7 (ref 5.0–7.5)
PROTEIN UA: NEGATIVE
RBC, UA: NEGATIVE
Specific Gravity, UA: 1.015 (ref 1.005–1.030)
Urobilinogen, Ur: 0.2 mg/dL (ref 0.2–1.0)

## 2016-06-09 LAB — MICROSCOPIC EXAMINATION
Epithelial Cells (non renal): NONE SEEN /hpf (ref 0–10)
RBC MICROSCOPIC, UA: NONE SEEN /HPF (ref 0–?)
RENAL EPITHEL UA: NONE SEEN /HPF

## 2016-06-10 ENCOUNTER — Other Ambulatory Visit: Payer: Self-pay | Admitting: Licensed Clinical Social Worker

## 2016-06-10 LAB — URINE CULTURE

## 2016-06-10 NOTE — Patient Outreach (Signed)
Assessment:    CSW spoke via phone with client.  CSW verified client identity. CSW received verbal permission from client on 06/10/16 for CSW to speak with client about current client needs. Client said she had her prescribed medications and is taking medications as prescribed.  Client sees Dr. Ermalinda Memos as primary care doctor. She is trying to attend scheduled client medical appointments.  Client has support from her son. Her son transports client to and from client's scheduled medical appointments.  Client does receive Meals on Wheels lunch meals delivery 5 days weekly.  CSW and client spoke of client care plan. CSW encouraged client to communicate with CSW in next 30 days to discuss community resources for food assistance for client. Client said she is taking antibiotic as prescribed.  Client said she had adequate food supply.  Client said she gets some sleep at night.  Client has back pain.  She has talked with Jannifer Rodney about pain issues of client. CSW encouraged client to call CSW at [phone] as needed to discuss social work needs of client.  Client was appreciative of call from CSW on 06/10/16.    Plan:  Client to communicate with CSW in next 30 days to discuss community resources for food assistance for client.  CSW to call client in 4 weeks to assess client needs.  Kelton Pillar.Jyquan Kenley MSW, LCSW Licensed Clinical Social Worker [facility] Care Management [phone]

## 2016-07-01 ENCOUNTER — Ambulatory Visit: Payer: Medicare Other | Admitting: Family Medicine

## 2016-07-07 ENCOUNTER — Ambulatory Visit (INDEPENDENT_AMBULATORY_CARE_PROVIDER_SITE_OTHER): Payer: Medicare Other | Admitting: Family

## 2016-07-07 ENCOUNTER — Ambulatory Visit (INDEPENDENT_AMBULATORY_CARE_PROVIDER_SITE_OTHER): Payer: Medicare Other

## 2016-07-07 ENCOUNTER — Encounter: Payer: Self-pay | Admitting: Family

## 2016-07-07 VITALS — BP 128/79 | HR 68 | Temp 97.2°F | Ht 65.0 in | Wt 143.2 lb

## 2016-07-07 DIAGNOSIS — Z1159 Encounter for screening for other viral diseases: Secondary | ICD-10-CM

## 2016-07-07 DIAGNOSIS — M810 Age-related osteoporosis without current pathological fracture: Secondary | ICD-10-CM

## 2016-07-07 DIAGNOSIS — J301 Allergic rhinitis due to pollen: Secondary | ICD-10-CM

## 2016-07-07 DIAGNOSIS — E559 Vitamin D deficiency, unspecified: Secondary | ICD-10-CM

## 2016-07-07 DIAGNOSIS — Z780 Asymptomatic menopausal state: Secondary | ICD-10-CM

## 2016-07-07 DIAGNOSIS — Z1211 Encounter for screening for malignant neoplasm of colon: Secondary | ICD-10-CM | POA: Diagnosis not present

## 2016-07-07 DIAGNOSIS — F411 Generalized anxiety disorder: Secondary | ICD-10-CM | POA: Diagnosis not present

## 2016-07-07 DIAGNOSIS — Z23 Encounter for immunization: Secondary | ICD-10-CM

## 2016-07-07 DIAGNOSIS — R309 Painful micturition, unspecified: Secondary | ICD-10-CM | POA: Diagnosis not present

## 2016-07-07 DIAGNOSIS — J449 Chronic obstructive pulmonary disease, unspecified: Secondary | ICD-10-CM

## 2016-07-07 LAB — URINALYSIS, COMPLETE
BILIRUBIN UA: NEGATIVE
Glucose, UA: NEGATIVE
Ketones, UA: NEGATIVE
Leukocytes, UA: NEGATIVE
Nitrite, UA: NEGATIVE
PH UA: 6.5 (ref 5.0–7.5)
PROTEIN UA: NEGATIVE
RBC UA: NEGATIVE
Specific Gravity, UA: 1.01 (ref 1.005–1.030)
UUROB: 0.2 mg/dL (ref 0.2–1.0)

## 2016-07-07 LAB — MICROSCOPIC EXAMINATION
Bacteria, UA: NONE SEEN
Epithelial Cells (non renal): NONE SEEN /hpf (ref 0–10)
RBC, UA: NONE SEEN /hpf (ref 0–?)

## 2016-07-07 NOTE — Addendum Note (Signed)
Addended by: Almeta MonasSTONE, JANIE M on: 07/07/2016 09:22 AM   Modules accepted: Orders

## 2016-07-07 NOTE — Patient Instructions (Signed)
Osteoporosis Osteoporosis happens when your bones become thinner and weaker. Weak bones can break (fracture) more easily when you slip or fall. Bones most at risk of breaking are in the hip, wrist, and spine. Follow these instructions at home:  Get enough calcium and vitamin D. These nutrients are good for your bones.  Exercise as told by your doctor.  Do not use any tobacco products. This includes cigarettes, chewing tobacco, and electronic cigarettes. If you need help quitting, ask your doctor.  Limit the amount of alcohol you drink.  Take medicines only as told by your doctor.  Keep all follow-up visits as told by your doctor. This is important.  Take care at home to prevent falls. Some ways to do this are:  Keep rooms well lit and tidy.  Put safety rails on your stairs.  Put a rubber mat in the bathroom and other places that are often wet or slippery. Get help right away if:  You fall.  You hurt yourself. This information is not intended to replace advice given to you by your health care provider. Make sure you discuss any questions you have with your health care provider. Document Released: 05/10/2011 Document Revised: 07/24/2015 Document Reviewed: 07/26/2013 Elsevier Interactive Patient Education  2017 Elsevier Inc.  

## 2016-07-07 NOTE — Progress Notes (Signed)
Subjective:    Patient ID: Lindsey May, female    DOB: January 03, 1951, 66 y.o.   MRN: [id number]  Pt presents to the office today for chronic follow up. PT's PCP is out of the office today. PT complaining her allergies are worse today with the pollen.  Anxiety  Presents for follow-up visit. Symptoms include excessive worry, irritability, nervous/anxious behavior and panic. Patient reports no decreased concentration or nausea. Symptoms occur occasionally. The severity of symptoms is mild.    Dysuria   This is a recurrent problem. The current episode started 1 to 4 weeks ago. The problem occurs intermittently. The problem has been waxing and waning. The quality of the pain is described as burning. The pain is at a severity of 5/10. The pain is moderate. Associated symptoms include flank pain, hesitancy and urgency. Pertinent negatives include no nausea or vomiting. She has tried increased fluids for the symptoms. The treatment provided mild relief.  COPD PT states "as long as I stay indoors I'm fine". She does not currently smoke. She uses albuterol nebulizer twice a day and uses her advair BID.  Osteoporosis  PT taking calcium and Vit D OTC. Last Dexa Scan 06/25/14.    Review of Systems  Constitutional: Positive for irritability.  Gastrointestinal: Negative for nausea and vomiting.  Genitourinary: Positive for dysuria, flank pain, hesitancy and urgency.  Psychiatric/Behavioral: Negative for decreased concentration. The patient is nervous/anxious.   All other systems reviewed and are negative.      Objective:   Physical Exam  Constitutional: She is oriented to person, place, and time. She appears well-developed and well-nourished. No distress.  HENT:  Head: Normocephalic and atraumatic.  Right Ear: External ear normal.  Left Ear: External ear normal.  Nose: Nose normal.  Mouth/Throat: Oropharynx is clear and moist. She does not have dentures (no teeth present).  Eyes: Pupils are  equal, round, and reactive to light.  Neck: Normal range of motion. Neck supple. No thyromegaly present.  Cardiovascular: Normal rate, regular rhythm, normal heart sounds and intact distal pulses.   No murmur heard. Pulmonary/Chest: Effort normal and breath sounds normal. No respiratory distress. She has no wheezes.  Abdominal: Soft. Bowel sounds are normal. She exhibits no distension. There is no tenderness.  Musculoskeletal: Normal range of motion. She exhibits edema (trace right leg). She exhibits no tenderness.  Neurological: She is alert and oriented to person, place, and time. She has normal reflexes. No cranial nerve deficit.  Skin: Skin is warm and dry.  Psychiatric: She has a normal mood and affect. Her behavior is normal. Judgment and thought content normal.  Vitals reviewed.   BP 128/79   Pulse 68   Temp 97.2 F (36.2 C) (Oral)   Ht _0  (1.651 m)   Wt 143 lb 3.2 oz (65 kg)   BMI 23.83 kg/m        Assessment & Plan:  1. Pain passing urine - Urinalysis, Complete - Urine culture - CMP14+EGFR  2. Chronic obstructive pulmonary disease, unspecified COPD type ([place]) - CMP14+EGFR  3. Seasonal allergic rhinitis due to pollen - CMP14+EGFR  4. Osteoporosis, unspecified osteoporosis type, unspecified pathological fracture presence  - CMP14+EGFR - DG [facility] DEXA  5. GAD (generalized anxiety disorder) - CMP14+EGFR  6. Vitamin D deficiency  - CMP14+EGFR  7. Post-menopause - CMP14+EGFR - DG [facility] DEXA  8. Colon cancer screening  - CMP14+EGFR - Fecal occult blood, imunochemical; Future  9. Need for hepatitis C screening test - Hepatitis C antibody  Continue all meds Labs pending Health Maintenance reviewed-  Pneumococcal 23 given today Diet and exercise encouraged RTO 6 months with PCP  Evelina Dun, FNP

## 2016-07-08 ENCOUNTER — Other Ambulatory Visit: Payer: Self-pay | Admitting: Family

## 2016-07-08 LAB — CMP14+EGFR
A/G RATIO: 2 (ref 1.2–2.2)
ALK PHOS: 101 IU/L (ref 39–117)
ALT: 12 IU/L (ref 0–32)
AST: 20 IU/L (ref 0–40)
Albumin: 4.2 g/dL (ref 3.6–4.8)
BUN/Creatinine Ratio: 13 (ref 12–28)
BUN: 10 mg/dL (ref 8–27)
Bilirubin Total: 0.3 mg/dL (ref 0.0–1.2)
CHLORIDE: 96 mmol/L (ref 96–106)
CO2: 23 mmol/L (ref 18–29)
Calcium: 9.4 mg/dL (ref 8.7–10.3)
Creatinine, Ser: 0.76 mg/dL (ref 0.57–1.00)
GFR calc Af Amer: 93 mL/min/{1.73_m2} (ref >59)
GFR calc non Af Amer: 81 mL/min/{1.73_m2} (ref >59)
GLOBULIN, TOTAL: 2.1 g/dL (ref 1.5–4.5)
Glucose: 84 mg/dL (ref 65–99)
POTASSIUM: 3.9 mmol/L (ref 3.5–5.2)
SODIUM: 137 mmol/L (ref 134–144)
Total Protein: 6.3 g/dL (ref 6.0–8.5)

## 2016-07-08 LAB — HEPATITIS C ANTIBODY

## 2016-07-09 ENCOUNTER — Other Ambulatory Visit: Payer: Self-pay | Admitting: Family

## 2016-07-09 LAB — URINE CULTURE

## 2016-07-09 MED ORDER — CIPROFLOXACIN HCL 500 MG PO TABS: 500.0000 mg | ORAL_TABLET | Freq: Two times a day (BID) | ORAL | 0 refills | Status: DC

## 2016-07-12 ENCOUNTER — Other Ambulatory Visit: Payer: Self-pay | Admitting: Licensed Clinical Social Worker

## 2016-07-12 NOTE — Patient Outreach (Signed)
Assessment:  CSW spoke via phone with client. CSW verified client identity. CSW received verbal permission from client on 07/12/16 for CSW to communicate with client about client status and needs. Client sees Dr. Ermalinda MemosBradshaw as primary doctor. Client said she had her prescribed medications and was taking medications as prescribed.  Client is trying to attend scheduled client medical appointments. Client's son transports client to and from client's scheduled medical appointments.  Client does receive Meals on Wheels lunch meals delivery 5 days weekly. CSW and client spoke of client care plan. CSW encouraged client to communicate with CSW in the next 30 days to discuss community resources for food assistance for client. Client said she has occasional back pain. She said she has talked with Lindsey May, Family Nurse Practitioner, about back pain issues of client .Client is taking prescribed medications as ordered. She is sleeping adequately. She said her son works during the day. She said her son provides her with the main transport assistance for client as needed. She said she is aware of local food pantries and tries to go to local food pantries as she is able.  CSW encouraged Lindsey May to call CSW at [phone] as needed to discuss social work needs of client. Client said she still has financial challenges; but she is trying to pay her bills on time each month. Client was appreciative of phone call from CSW on 07/12/16.   Plan:  Client to communicate with CSW in the next 30 days to discuss community resources for food assistance for client.  CSW to call client in 4 weeks to assess client needs.  Lindsey MayGenine May MSW, LCSW Licensed Clinical Social Worker [facility] Care Management [phone]

## 2016-07-15 ENCOUNTER — Other Ambulatory Visit: Payer: Self-pay | Admitting: Family

## 2016-08-13 ENCOUNTER — Other Ambulatory Visit: Payer: Self-pay | Admitting: Licensed Clinical Social Worker

## 2016-08-13 NOTE — Patient Outreach (Signed)
Assessment:  CSW spoke via phone with client. CSW verified client identity. CSW received verbal permission from client on 08/13/16 for CSW to communicate with client about current client needs and status. Client sees Dr. Ermalinda MemosBradshaw as primary care doctor. Client said she had her prescribed medications and is taking medications as prescribed. Client said she is trying to attend scheduled client medical appointments.  Client said her son transports client to and from client's scheduled medical appointments. Client does receive Meals on Wheels lunch meals delivery 5 days weekly. CSW and client spoke of client care plan. CSW encouraged client to communicate with CSW in next 30 days to discuss community resources for food assistance for client.  Client said she is sleeping adequately. She said she is aware of local food pantries in the area. She said she goes to local food pantries when she can arrange transport assistance for travel to and from these food pantries. Client said she has financial challenges.  She said she is trying to pay her monthly bills on schedule each month.  Client has fibromyalgia. She has talked with Dr. Ermalinda MemosBradshaw about fibromyalgia and medication to assist in managing fibromyalgia. Client said she has also talked with Elvin Soammy Eckerd, pharmacist, at [facility], about client medications. Client said she takes nebulizer treatments daily as scheduled.  Client said she ambulates well; she said she is experiencing some allergies at present. She has talked with Dr. Ermalinda MemosBradshaw about her allergies and treatment for her allergies. She said she has appointment next week with Dr. Ermalinda MemosBradshaw.    Client said she receives a [employer] benefit each month.  Client said she sometimes goes to agency in [place] to receive some food assistance. CSW and client spoke of local food resource agencies in the community.  CSW encouraged client to call CSW at [phone] as needed to  discuss social work needs of client. CSW thanked client for phone call with CSW on 08/13/16. Client was appreciative of phone call from CSW on 08/13/16.  Plan:  Client to communicate with CSW in next 30 days to discuss community resources for food assistance for client.  CSW to call client in 4 weeks to assess client needs at that time.  Kelton PillarMichael S.Derik Fults MSW, LCSW Licensed Clinical Social Worker [facility] Care Management [phone]

## 2016-08-16 ENCOUNTER — Other Ambulatory Visit: Payer: Self-pay | Admitting: Family Medicine

## 2016-08-19 ENCOUNTER — Other Ambulatory Visit: Payer: Self-pay | Admitting: Family Medicine

## 2016-08-31 ENCOUNTER — Encounter: Payer: Self-pay | Admitting: Family

## 2016-08-31 ENCOUNTER — Ambulatory Visit (INDEPENDENT_AMBULATORY_CARE_PROVIDER_SITE_OTHER): Payer: Medicare Other | Admitting: Family

## 2016-08-31 VITALS — BP 109/67 | HR 77 | Temp 97.4°F | Ht 65.0 in | Wt 140.8 lb

## 2016-08-31 DIAGNOSIS — N3000 Acute cystitis without hematuria: Secondary | ICD-10-CM

## 2016-08-31 DIAGNOSIS — J0100 Acute maxillary sinusitis, unspecified: Secondary | ICD-10-CM

## 2016-08-31 DIAGNOSIS — R300 Dysuria: Secondary | ICD-10-CM

## 2016-08-31 DIAGNOSIS — J449 Chronic obstructive pulmonary disease, unspecified: Secondary | ICD-10-CM | POA: Diagnosis not present

## 2016-08-31 LAB — URINALYSIS
BILIRUBIN UA: NEGATIVE
Glucose, UA: NEGATIVE
KETONES UA: NEGATIVE
Leukocytes, UA: NEGATIVE
Nitrite, UA: POSITIVE — AB
PROTEIN UA: NEGATIVE
RBC, UA: NEGATIVE
SPEC GRAV UA: 1.01 (ref 1.005–1.030)
UUROB: 0.2 mg/dL (ref 0.2–1.0)
pH, UA: 7 (ref 5.0–7.5)

## 2016-08-31 MED ORDER — PREDNISONE 10 MG (21) PO TBPK: ORAL_TABLET | ORAL | 0 refills | Status: DC

## 2016-08-31 MED ORDER — AMOXICILLIN-POT CLAVULANATE 875-125 MG PO TABS: 1.0000 | ORAL_TABLET | Freq: Two times a day (BID) | ORAL | 0 refills | Status: DC

## 2016-08-31 NOTE — Addendum Note (Signed)
Addended by: Lorelee CoverOSTOSKY, Cydne Grahn C on: 08/31/2016 09:04 AM   Modules accepted: Orders

## 2016-08-31 NOTE — Progress Notes (Signed)
Subjective:    Patient ID: Lindsey May, female    DOB: 13-Oct-1950, 66 y.o.   MRN: [id number]  Dysuria   The current episode started in the past 7 days. The problem occurs every urination. The problem has been gradually worsening. The quality of the pain is described as burning. The pain is mild. Associated symptoms include chills, flank pain, frequency and urgency. Pertinent negatives include no nausea or vomiting. She has tried increased fluids for the symptoms. The treatment provided mild relief.  Wheezing   Associated symptoms include chills, coughing, ear pain, a fever, headaches, shortness of breath and a sore throat. Pertinent negatives include no rhinorrhea or vomiting.  Cough  The current episode started in the past 7 days. The problem has been gradually worsening. The problem occurs every few minutes. The cough is productive of sputum and productive of purulent sputum. Associated symptoms include chills, ear pain, a fever, headaches, myalgias, nasal congestion, a sore throat, shortness of breath and wheezing. Pertinent negatives include no ear congestion or rhinorrhea. The symptoms are aggravated by lying down. She has tried rest for the symptoms. The treatment provided mild relief.      Review of Systems  Constitutional: Positive for chills and fever.  HENT: Positive for ear pain and sore throat. Negative for rhinorrhea.   Respiratory: Positive for cough, shortness of breath and wheezing.   Gastrointestinal: Negative for nausea and vomiting.  Genitourinary: Positive for dysuria, flank pain, frequency and urgency.  Musculoskeletal: Positive for myalgias.  Neurological: Positive for headaches.  All other systems reviewed and are negative.      Objective:   Physical Exam  Constitutional: She is oriented to person, place, and time. She appears well-developed and well-nourished. No distress.  HENT:  Head: Normocephalic and atraumatic.  Right Ear: External ear normal.  Left  Ear: External ear normal.  Nose: Mucosal edema and rhinorrhea present. Right sinus exhibits maxillary sinus tenderness. Left sinus exhibits maxillary sinus tenderness.  Mouth/Throat: Posterior oropharyngeal erythema present.  Eyes: Pupils are equal, round, and reactive to light.  Neck: Normal range of motion. Neck supple. No thyromegaly present.  Cardiovascular: Normal rate, regular rhythm, normal heart sounds and intact distal pulses.   No murmur heard. Pulmonary/Chest: Effort normal. No respiratory distress. She has wheezes.  Abdominal: Soft. Bowel sounds are normal. She exhibits no distension. There is tenderness (mild lower abd tenderness).  Musculoskeletal: Normal range of motion. She exhibits no edema or tenderness.  Neurological: She is alert and oriented to person, place, and time. She has normal reflexes. No cranial nerve deficit.  Skin: Skin is warm and dry.  Psychiatric: She has a normal mood and affect. Her behavior is normal. Judgment and thought content normal.  Vitals reviewed.     BP 109/67   Pulse 77   Temp 97.4 F (36.3 C) (Oral)   Ht 5\' 5"  (1.651 m)   Wt 140 lb 12.8 oz (63.9 kg)   BMI 23.43 kg/m      Assessment & Plan:  1. Dysuria - Urinalysis, Complete  2. Acute cystitis without hematuria Force fluids AZO over the counter X2 days RTO prn Culture pending - amoxicillin-clavulanate (AUGMENTIN) 875-125 MG tablet; Take 1 tablet by mouth 2 (two) times daily.  Dispense: 14 tablet; Refill: 0 - Urine Culture  3. Acute maxillary sinusitis, recurrence not specified - Take meds as prescribed - Use a cool mist humidifier  -Use saline nose sprays frequently -Saline irrigations of the nose can be very helpful if done  frequently.  * 4X daily for 1 week*  * Use of a nettie pot can be helpful with this. Follow directions with this* -Force fluids -For any cough or congestion  Use plain Mucinex- regular strength or max strength is fine   * Children- consult with  Pharmacist for dosing -For fever or aces or pains- take tylenol or ibuprofen appropriate for age and weight.  * for fevers greater than 101 orally you may alternate ibuprofen and tylenol every  3 hours. -Throat lozenges if help -New toothbrush in 3 days - amoxicillin-clavulanate (AUGMENTIN) 875-125 MG tablet; Take 1 tablet by mouth 2 (two) times daily.  Dispense: 14 tablet; Refill: 0 - predniSONE (STERAPRED UNI-PAK 21 TAB) 10 MG (21) TBPK tablet; Use as directed  Dispense: 21 tablet; Refill: 0  4. Chronic obstructive pulmonary disease, unspecified COPD type (HCC) Continue albuterol and inhalers - predniSONE (STERAPRED UNI-PAK 21 TAB) 10 MG (21) TBPK tablet; Use as directed  Dispense: 21 tablet; Refill: 0  Lindsey Rodneyhristy Lucilla Petrenko, FNP

## 2016-08-31 NOTE — Patient Instructions (Signed)

## 2016-09-05 LAB — URINE CULTURE

## 2016-09-06 ENCOUNTER — Other Ambulatory Visit: Payer: Self-pay | Admitting: Licensed Clinical Social Worker

## 2016-09-06 NOTE — Patient Outreach (Signed)
Assessment:  CSW received call from client on 09/06/16. Client reported that she had experienced another kidney infection. She said that her doctor had prescribed Prednisone for her related to kidney infection.  She said she had picked up prescription for Prednisone from pharmacy and was taking Prednisone as prescribed by her doctor. She said she had been staying indoors to avoid the heat. She spoke of financial challenges each month and of difficulty paying for incidental items needed for her home monthly.  She said her son transports her to and from her medical appointments. She said she had her prescribed medications at present.  CSW encouraged client to communicate with CSW in next 30 days to discuss community resources for food assistance for client. CSW encouraged client to call CSW as needed at [phone] to discuss social work needs of client.     Plan:  Client to communicate with CSW in next 30 days to discuss community resources for food assistance for client.  CSW to call client as scheduled to assess client needs at that time.  Kelton PillarMichael S.Verdis Koval MSW, LCSW Licensed Clinical Social Worker [facility] Care Management [phone]

## 2016-09-13 ENCOUNTER — Other Ambulatory Visit: Payer: Self-pay | Admitting: Family Medicine

## 2016-09-14 ENCOUNTER — Other Ambulatory Visit: Payer: Self-pay | Admitting: Licensed Clinical Social Worker

## 2016-09-14 NOTE — Telephone Encounter (Signed)
Phoned in.

## 2016-09-14 NOTE — Patient Outreach (Signed)
Assessment:  CSW spoke via phone with client. CSW verified client identity. CSW received verbal permission on 09/14/16 for CSW to communicate with client about current client needs and status Client sees Dr. Ermalinda MemosBradshaw as primary care doctor. Client said she was recently prescribed Prednisone by her doctor to assist with kidney infection issue. She said she obtained prescribed medication (Prednisone) from her pharmacy and has taken Prednisone as prescribed by her doctor.  CSW and client spoke of client care plan. CSW encouraged client to communicate with CSW in next 30 days to discuss community resources for food assistance for client. Client said her son occasionally takes client to local food pantries for food assistance. She said her son transports client to and from client's scheduled medical appointments. Client has financial challenges each month.  She said she does receive Meals on Wheels lunch meals delivery 5 days per week.  Client said she had completed her dosage of prescribed Prednisone.  She said she will return for an appointment with her medical provider in two weeks.  She said she has kidney infection.  Client said she takes nebulizer treatments daily as prescribed. She said she has inhaler to use as prescribed. Client said she goes to [employer] one time monthly for food support. She said she goes to agency in [place] for food assistance occasionally. Client said she often needs paper products, laundry detergent, home cleaning items and has trouble purchasing these after buying her needed medications or paying monthly expenses.  CSW thanked client for phone call with CSW on 09/14/16. CSW encouraged client to call CSW at [phone] as needed to discuss social work needs of client.    Plan:    Client to communicate with CSW in next 30 days to discuss community resources for food assistance for client.  CSW to call client in 4 weeks to assess client needs at that time.  Kelton PillarMichael  S.Anntonette Madewell MSW, LCSW Licensed Clinical Social Worker [facility] Care Management [phone]

## 2016-09-14 NOTE — Telephone Encounter (Signed)
Last filled 08/17/16, last seen 05/10/16. Route to pool for call in or denial

## 2016-09-15 ENCOUNTER — Other Ambulatory Visit: Payer: Self-pay | Admitting: Family

## 2016-09-15 DIAGNOSIS — J0100 Acute maxillary sinusitis, unspecified: Secondary | ICD-10-CM

## 2016-09-15 DIAGNOSIS — J449 Chronic obstructive pulmonary disease, unspecified: Secondary | ICD-10-CM

## 2016-09-15 NOTE — Telephone Encounter (Signed)
Last seen 08/31/16  [facility]

## 2016-09-17 ENCOUNTER — Other Ambulatory Visit: Payer: Self-pay | Admitting: Licensed Clinical Social Worker

## 2016-09-17 NOTE — Patient Outreach (Signed)
Assessment:  CSW traveled to home of client in [place], [place] on 09/17/16.  CSW met with client on 09/17/16 at home of client in [place], [place].  [place] received verbal permission from client  on 09/17/16 for CSW to communicate with client about current client  needs and status. Client said she had been going to scheduled client medical appointments. She said she sees Dr. Wendi Snipes as her primary care doctor. She said she had her prescribed medications and is taking medications as prescribed.  CSW and client spoke of client care plan. CSW encouraged client to communicate with CSW in next 30 days to discuss community resources for food assistance for client. Client said she had taken all prescribed Prednisone at this time. Client said she will have appointment next week with Evelina Dun, Family Nurse Practitioner, to assess client needs. Client said she has been fatigued and has to take periodic rest breaks.  Client said she goes occasionally to a church food pantry in [place], [place] for food assistance. She said she is able to go to that food pantry one time monthly if church food pantry has available food. Client likes to watch TV to relax. Client said her son transports her to and from her scheduled medical appointments. Client said she walks well in her home. Client said she drinks water, canberry juice, citris green tea, and regular tea. She said she has pain regularly when urinating.  She has talked with Sharion Balloon and Dr. Wendi Snipes about pain in urinating.   Client said she sleeps a few hours nightly.  Client said she has financial challenges each month. She said she is paying her monthly bills but funds are limited.  CSW gave client [facility] CSW   card. CSW encouraged client to call CSW as needed to discuss social work needs of client. CSW reminded client of [facility] program services in nursing, social work and pharmacy. Client said she uses nebulizer daily as prescribed. Client was appreciative of home visit of CSW with  client on 09/17/16.   Plan:   Client to communicate with CSW in next 30 days to discuss community resources for food assistance for client.  CSW to call client as scheduled to assess client needs.  Norva Riffle.Gehrig Patras MSW, LCSW Licensed Clinical Social Worker [facility] Care Management [phone]

## 2016-09-20 ENCOUNTER — Other Ambulatory Visit: Payer: Self-pay | Admitting: *Deleted

## 2016-09-20 MED ORDER — FLUTICASONE PROPIONATE 50 MCG/ACT NA SUSP: 2.0000 | Freq: Every day | NASAL | 5 refills | Status: DC

## 2016-09-20 MED ORDER — ROPINIROLE HCL 0.25 MG PO TABS: ORAL_TABLET | ORAL | 0 refills | Status: DC

## 2016-09-21 ENCOUNTER — Encounter: Payer: Self-pay | Admitting: Family

## 2016-09-21 ENCOUNTER — Ambulatory Visit (INDEPENDENT_AMBULATORY_CARE_PROVIDER_SITE_OTHER): Payer: Medicare Other | Admitting: Family

## 2016-09-21 VITALS — BP 103/67 | HR 74 | Temp 98.3°F | Ht 65.0 in | Wt 141.0 lb

## 2016-09-21 DIAGNOSIS — R300 Dysuria: Secondary | ICD-10-CM

## 2016-09-21 DIAGNOSIS — N3001 Acute cystitis with hematuria: Secondary | ICD-10-CM

## 2016-09-21 MED ORDER — NITROFURANTOIN MONOHYD MACRO 100 MG PO CAPS: 100.0000 mg | ORAL_CAPSULE | Freq: Two times a day (BID) | ORAL | 0 refills | Status: DC

## 2016-09-21 NOTE — Progress Notes (Signed)
   Subjective:    Patient ID: Lindsey LevelAlma May, female    DOB: 18-Jan-1951, 66 y.o.   MRN: [id number]  PT presents to the office today with recurrent UTI. PT has been treated for UTI 6 times since 01/29/16.  Dysuria   This is a recurrent problem. The current episode started in the past 7 days. The problem occurs intermittently. The problem has been gradually worsening. The quality of the pain is described as burning. The pain is at a severity of 8/10. There has been no fever. Associated symptoms include flank pain, frequency, hesitancy, nausea and urgency. Pertinent negatives include no discharge, hematuria or vomiting. She has tried increased fluids and antibiotics for the symptoms. The treatment provided mild relief.      Review of Systems  Gastrointestinal: Positive for nausea. Negative for vomiting.  Genitourinary: Positive for dysuria, flank pain, frequency, hesitancy and urgency. Negative for hematuria.  All other systems reviewed and are negative.      Objective:   Physical Exam  Constitutional: She is oriented to person, place, and time. She appears well-developed and well-nourished. No distress.  HENT:  Head: Normocephalic.  Eyes: Pupils are equal, round, and reactive to light.  Neck: Normal range of motion. Neck supple. No thyromegaly present.  Cardiovascular: Normal rate, regular rhythm, normal heart sounds and intact distal pulses.   No murmur heard. Pulmonary/Chest: Effort normal and breath sounds normal. No respiratory distress. She has no wheezes.  Abdominal: Soft. Bowel sounds are normal. She exhibits no distension. There is no tenderness.  Musculoskeletal: Normal range of motion. She exhibits no edema or tenderness.  Neurological: She is alert and oriented to person, place, and time.  Skin: Skin is warm and dry.  Psychiatric: She has a normal mood and affect. Her behavior is normal. Judgment and thought content normal.  Vitals reviewed.     BP 103/67   Pulse 74    Temp 98.3 F (36.8 C) (Oral)   Ht 5\' 5"  (1.651 m)   Wt 141 lb (64 kg)   BMI 23.46 kg/m      Assessment & Plan:  1. Dysuria - Urinalysis, Complete  2. Acute cystitis with hematuria Force fluids AZO over the counter X2 days RTO prn Culture pending - Ambulatory referral to Urology - nitrofurantoin, macrocrystal-monohydrate, (MACROBID) 100 MG capsule; Take 1 capsule (100 mg total) by mouth 2 (two) times daily.  Dispense: 10 capsule; Refill: 0 - Urine Culture    Lindsey Rodneyhristy Rayvn Rickerson, FNP

## 2016-09-21 NOTE — Addendum Note (Signed)
Addended by: Jannifer RodneyHAWKS, Kessler Kopinski A on: 09/21/2016 08:44 AM   Modules accepted: Orders

## 2016-09-21 NOTE — Patient Instructions (Signed)

## 2016-09-22 LAB — URINALYSIS, COMPLETE
Bilirubin, UA: NEGATIVE
GLUCOSE, UA: NEGATIVE
Ketones, UA: NEGATIVE
NITRITE UA: POSITIVE — AB
Protein, UA: NEGATIVE
RBC, UA: NEGATIVE
SPEC GRAV UA: 1.01 (ref 1.005–1.030)
Urobilinogen, Ur: 0.2 mg/dL (ref 0.2–1.0)
pH, UA: 7 (ref 5.0–7.5)

## 2016-09-22 LAB — MICROSCOPIC EXAMINATION

## 2016-09-23 LAB — URINE CULTURE

## 2016-09-28 ENCOUNTER — Other Ambulatory Visit: Payer: Self-pay

## 2016-09-28 MED ORDER — FLUTICASONE PROPIONATE 50 MCG/ACT NA SUSP: 2.0000 | Freq: Every day | NASAL | 5 refills | Status: DC

## 2016-09-28 NOTE — Addendum Note (Signed)
Addended by: Julious PayerHOLT, CATHERINE D on: 09/28/2016 03:09 PM   Modules accepted: Orders

## 2016-09-28 NOTE — Telephone Encounter (Signed)
Pt had pharmacy send request to have prednisone refilled Have wheezing She was seen on 7/24 for her kidneys, wheezing was better so prednisone not refilled Disregarding the fax request

## 2016-10-04 ENCOUNTER — Other Ambulatory Visit: Payer: Self-pay | Admitting: Licensed Clinical Social Worker

## 2016-10-04 NOTE — Patient Outreach (Signed)
Assessment:  CSW received phone call from client on 10/04/16. CSW verified client identity. CSW received verbal permission from client on 10/04/16 for CSW to communicate with client about current client needs and status. Client said she did have her prescribed medications and was taking medications as prescribed. She said she is attending scheduled appointments with Dr. Ermalinda MemosBradshaw, her primary care doctor. She said that she had now been scheduled for an appointment with a kidney specialist in [place], [place] in October of 2018.  She said that she is taking her medications as prescribed. She said that if she had any medical needs prior to her appointment with the kidney specialist, she would contact Dr. Ermalinda MemosBradshaw, her primary care doctor. She said she has a friend who sometimes helps in transporting client to and from clients medical appointments. Also, her son sometimes transports client to and from client's medical appointments. Client said she is paying her bills on time but sometimes has difficulty paying for household cleaning supplies or for certain clothing items.  [employer] has helped client several times with clothing items for client and with cleaning supplies for client.  CSW encouraged client to call CSW as needed at [phone] to discuss social work needs of client.   Plan:  Client to attend all scheduled client medical appointments, both with Dr. Ermalinda MemosBradshaw (primary care doctor) and with client's kidney specialist in October of 2018.  CSW to call client as scheduled to assess client needs.  Kelton PillarMichael S.Orvella Digiulio MSW, LCSW Licensed Clinical Social Worker [facility] [phone]

## 2016-10-11 ENCOUNTER — Other Ambulatory Visit: Payer: Self-pay | Admitting: Family Medicine

## 2016-10-12 NOTE — Telephone Encounter (Signed)
Last seen 09/21/16   [name]  Dr Ermalinda MemosBradshaw PCP  If approved route to nurse to call into [facility]

## 2016-10-13 NOTE — Telephone Encounter (Signed)
Rx sent in. °Patient aware °

## 2016-10-14 ENCOUNTER — Other Ambulatory Visit: Payer: Self-pay | Admitting: Licensed Clinical Social Worker

## 2016-10-14 NOTE — Patient Outreach (Signed)
Assessment:  CSW spoke via phone with client. CSW verified client identity. CSW received verbal permission from client on 10/14/16 for CSW to communicate with client about current client needs and status. CSW and client spoke of client needs. Client sees Dr. Ermalinda MemosBradshaw as primary care doctor. Client said she had her prescribed medications and is taking medications as prescribed. She said that she has been scheduled for an appointment with a kidney specialist in [place], [place] in October of 2018. She said that she has a friend who sometimes helps in transporting client to and from cilent's scheduled medical appointments.  Client's son also helps in transporting client to and from client's medical appointments. Client has financial challenges. She does try to pay her monthly bills on time each month. Yet, she has little funds remaining after monthly bills for client are paid. CSW has talked previously with client about resources from local food pantries in the area.  Client said she goes periodically to a church food pantry in [place], [place] for food assistance. Client said she fatigues easily and has to take periodic rest breaks.  Client said she has appointment at office of Dr. Ermalinda MemosBradshaw next Tuesday at 9:10 AM.  Client said she uses nebulizer machine for breathing treatments as prescribed. Client said that her Medicaid benefit does help with some of her medication costs.  Client has osteoporosis and fibromyalgia.  Client said she has adequate food supply. She said she will talk with her friend about possible transport help from friend to take her to and from church food pantry in [place], [place].  CSW and client spoke of client care plan. CSW encouraged client to attend all scheduled client medical appointments in the next 30 days. CSW encouraged Jaanai to call CSW at [phone] as needed to discuss social work needs of client. CSW thanked GeronimoAlma for phone call with CSW on 10/14/16. Client was appreciative of phone  call from CSW on 10/14/16.   Plan:  Client to attend all scheduled client medical appointments in the next 30 days.  CSW to call client in 3 weeks to assess client needs at that time.  Kelton PillarMichael S.Adriana Lina MSW, LCSW Licensed Clinical Social Worker [facility] [phone]

## 2016-10-19 ENCOUNTER — Encounter: Payer: Self-pay | Admitting: Physician Assistant

## 2016-10-19 ENCOUNTER — Ambulatory Visit (INDEPENDENT_AMBULATORY_CARE_PROVIDER_SITE_OTHER): Payer: Medicare Other | Admitting: Physician Assistant

## 2016-10-19 VITALS — BP 104/64 | HR 69 | Temp 96.8°F | Ht 65.0 in | Wt 141.6 lb

## 2016-10-19 DIAGNOSIS — J0110 Acute frontal sinusitis, unspecified: Secondary | ICD-10-CM

## 2016-10-19 DIAGNOSIS — R300 Dysuria: Secondary | ICD-10-CM

## 2016-10-19 DIAGNOSIS — N3001 Acute cystitis with hematuria: Secondary | ICD-10-CM

## 2016-10-19 LAB — URINALYSIS, COMPLETE
BILIRUBIN UA: NEGATIVE
Glucose, UA: NEGATIVE
Ketones, UA: NEGATIVE
Nitrite, UA: NEGATIVE
PH UA: 6.5 (ref 5.0–7.5)
Protein, UA: NEGATIVE
RBC UA: NEGATIVE
Specific Gravity, UA: 1.01 (ref 1.005–1.030)
Urobilinogen, Ur: 0.2 mg/dL (ref 0.2–1.0)

## 2016-10-19 LAB — MICROSCOPIC EXAMINATION: Renal Epithel, UA: NONE SEEN /hpf

## 2016-10-19 MED ORDER — AMOXICILLIN-POT CLAVULANATE 875-125 MG PO TABS: 1.0000 | ORAL_TABLET | Freq: Two times a day (BID) | ORAL | 0 refills | Status: DC

## 2016-10-19 MED ORDER — RISEDRONATE SODIUM 35 MG PO TBEC: 1.0000 | DELAYED_RELEASE_TABLET | ORAL | 11 refills | Status: DC

## 2016-10-19 NOTE — Patient Instructions (Signed)
In a few days you may receive a survey in the mail or online from [employer] regarding your visit with us today. Please take a moment to fill this out. Your feedback is very important to our whole office. It can help us better understand your needs as well as improve your experience and satisfaction. Thank you for taking your time to complete it. We care about you.  Kentley Cedillo, PA-C  

## 2016-10-19 NOTE — Progress Notes (Signed)
BP 104/64   Pulse 69   Temp (!) 96.8 F (36 C) (Oral)   Ht 5\' 5"  (1.651 m)   Wt 141 lb 9.6 oz (64.2 kg)   BMI 23.56 kg/m    Subjective:    Patient ID: Lindsey May, female    DOB: 1951/01/03, 66 y.o.   MRN: [id number]  HPI: Lindsey May is a 66 y.o. female presenting on 10/19/2016 for Sinusitis and Urinary Tract Infection  This patient has had 7 days of sinus headache and postnasal drainage. There is copious drainage at times. Denies any fever at this time. There has been a history of sinus infections in the past.  No history of sinus surgery. There is cough at night. It has become more prevalent in recent days. This patient has had several days of dysuria, frequency and nocturia. There is also pain over the bladder in the suprapubic region, no back pain. Denies leakage or hematuria.  Denies fever or chills. No pain in flank area.   Relevant past medical, surgical, family and social history reviewed and updated as indicated. Allergies and medications reviewed and updated.  Past Medical History:  Diagnosis Date  . Allergy   . Anxiety   . Arthritis   . COPD (chronic obstructive pulmonary disease) (HCC)   . Depression   . Fibromyalgia   . Osteoporosis   . Vertigo     Past Surgical History:  Procedure Laterality Date  . TUBAL LIGATION  1974    Review of Systems  Constitutional: Negative.  Negative for activity change, appetite change and fever.  HENT: Positive for congestion, postnasal drip, sinus pain and sinus pressure.   Eyes: Negative.   Respiratory: Negative.  Negative for cough, shortness of breath and wheezing.   Cardiovascular: Negative.  Negative for chest pain, palpitations and leg swelling.  Gastrointestinal: Negative.   Genitourinary: Positive for difficulty urinating, dysuria and urgency. Negative for flank pain.  Musculoskeletal: Negative.   Skin: Negative.   Neurological: Positive for headaches.    Allergies as of 10/19/2016      Reactions   Zithromax  [azithromycin]    Codeine Other (See Comments)      Medication List       Accurate as of 10/19/16  3:28 PM. Always use your most recent med list.          amoxicillin-clavulanate 875-125 MG tablet Commonly known as:  AUGMENTIN Take 1 tablet by mouth 2 (two) times daily.   aspirin EC 81 MG tablet Take 1 tablet (81 mg total) by mouth daily.   cetirizine 10 MG tablet Commonly known as:  ZYRTEC Take 1 tablet (10 mg total) by mouth daily.   DULoxetine 30 MG capsule Commonly known as:  CYMBALTA TAKE 1 CAPSULE (30 MG TOTAL) BY MOUTH DAILY.   fluticasone 50 MCG/ACT nasal spray Commonly known as:  FLONASE Place 2 sprays into both nostrils daily.   Fluticasone-Salmeterol 500-50 MCG/DOSE Aepb Commonly known as:  ADVAIR DISKUS Inhale 1 puff into the lungs 2 (two) times daily.   LORazepam 0.5 MG tablet Commonly known as:  ATIVAN TAKE 1 TABLET EVERY 8 HOURS AS NEEDED   meclizine 25 MG tablet Commonly known as:  ANTIVERT TAKE 1 TABLET (25 MG TOTAL) BY MOUTH 3 (THREE) TIMES DAILY AS NEEDED FOR DIZZINESS.   MULTI-VITAMIN/IRON PO Take 1 tablet by mouth daily.   nystatin cream Commonly known as:  MYCOSTATIN APPLY 1 APPLICATION TOPICALLY AT BEDTIME.   Risedronate Sodium 35 MG Tbec Take 1 tablet (  35 mg total) by mouth once a week. Can be taken with or without food.  Take with 4 ounces of liquid.   rOPINIRole 0.25 MG tablet Commonly known as:  REQUIP TAKE 1 TABLET AT BEDTIME THEN CAN GO TO 2 TABLETS AT BEDTIME IF ONE TAB DOESNT HELP   trimethoprim-polymyxin b ophthalmic solution Commonly known as:  POLYTRIM PLACE 1 DROP INTO BOTH EYES EVERY 4 (FOUR) HOURS AS NEEDED   VENTOLIN HFA 108 (90 Base) MCG/ACT inhaler Generic drug:  albuterol INHALE 2 PUFFS INTO THE LUNGS EVERY 6 (SIX) HOURS AS NEEDED FOR WHEEZING OR SHORTNESS OF BREATH.   albuterol (2.5 MG/3ML) 0.083% nebulizer solution Commonly known as:  PROVENTIL USE ONE VIAL IN NEBULIZER EVERY 6 HOURS AS NEEDED FOR WHEEZING OR  SHORTNESS OF BREATH   Vitamin D (Cholecalciferol) 1000 units Tabs Take 1,000 Units by mouth daily.          Objective:    BP 104/64   Pulse 69   Temp (!) 96.8 F (36 C) (Oral)   Ht 5\' 5"  (1.651 m)   Wt 141 lb 9.6 oz (64.2 kg)   BMI 23.56 kg/m   Allergies  Allergen Reactions  . Zithromax [Azithromycin]   . Codeine Other (See Comments)    Physical Exam  Constitutional: She is oriented to person, place, and time. She appears well-developed and well-nourished.  HENT:  Head: Normocephalic and atraumatic.  Right Ear: Tympanic membrane and external ear normal. No middle ear effusion.  Left Ear: Tympanic membrane and external ear normal.  No middle ear effusion.  Nose: Mucosal edema and rhinorrhea present. Right sinus exhibits no maxillary sinus tenderness. Left sinus exhibits no maxillary sinus tenderness.  Mouth/Throat: Uvula is midline. Posterior oropharyngeal erythema present.  Eyes: Pupils are equal, round, and reactive to light. Conjunctivae and EOM are normal. Right eye exhibits no discharge. Left eye exhibits no discharge.  Neck: Normal range of motion.  Cardiovascular: Normal rate, regular rhythm, normal heart sounds and intact distal pulses.   Pulmonary/Chest: Effort normal and breath sounds normal. No respiratory distress. She has no wheezes.  Abdominal: Soft. Bowel sounds are normal. She exhibits no distension and no mass. There is tenderness in the suprapubic area. There is no rebound, no guarding and no CVA tenderness.  Lymphadenopathy:    She has no cervical adenopathy.  Neurological: She is alert and oriented to person, place, and time. She has normal reflexes.  Skin: Skin is warm and dry. No rash noted.  Psychiatric: She has a normal mood and affect. Her behavior is normal. Judgment and thought content normal.    Results for orders placed or performed in visit on 10/19/16  Microscopic Examination  Result Value Ref Range   WBC, UA 6-10 (A) 0 - 5 /hpf   RBC, UA  0-2 0 - 2 /hpf   Epithelial Cells (non renal) 0-10 0 - 10 /hpf   Renal Epithel, UA None seen None seen /hpf   Bacteria, UA Few None seen/Few  Urinalysis, Complete  Result Value Ref Range   Specific Gravity, UA 1.010 1.005 - 1.030   pH, UA 6.5 5.0 - 7.5   Color, UA Yellow Yellow   Appearance Ur Clear Clear   Leukocytes, UA Trace (A) Negative   Protein, UA Negative Negative/Trace   Glucose, UA Negative Negative   Ketones, UA Negative Negative   RBC, UA Negative Negative   Bilirubin, UA Negative Negative   Urobilinogen, Ur 0.2 0.2 - 1.0 mg/dL   Nitrite, UA  Negative Negative   Microscopic Examination See below:       Assessment & Plan:   1. Dysuria - Urine Culture - Urinalysis, Complete - Microscopic Examination  2. Acute cystitis with hematuria - amoxicillin-clavulanate (AUGMENTIN) 875-125 MG tablet; Take 1 tablet by mouth 2 (two) times daily.  Dispense: 20 tablet; Refill: 0  3. Acute non-recurrent frontal sinusitis - amoxicillin-clavulanate (AUGMENTIN) 875-125 MG tablet; Take 1 tablet by mouth 2 (two) times daily.  Dispense: 20 tablet; Refill: 0    Current Outpatient Prescriptions:  .  albuterol (PROVENTIL) (2.5 MG/3ML) 0.083% nebulizer solution, USE ONE VIAL IN NEBULIZER EVERY 6 HOURS AS NEEDED FOR WHEEZING OR SHORTNESS OF BREATH, Disp: 150 mL, Rfl: 2 .  aspirin EC 81 MG tablet, Take 1 tablet (81 mg total) by mouth daily., Disp: , Rfl:  .  cetirizine (ZYRTEC) 10 MG tablet, Take 1 tablet (10 mg total) by mouth daily., Disp: 30 tablet, Rfl: 11 .  DULoxetine (CYMBALTA) 30 MG capsule, TAKE 1 CAPSULE (30 MG TOTAL) BY MOUTH DAILY., Disp: 30 capsule, Rfl: 2 .  fluticasone (FLONASE) 50 MCG/ACT nasal spray, Place 2 sprays into both nostrils daily., Disp: 16 g, Rfl: 5 .  Fluticasone-Salmeterol (ADVAIR DISKUS) 500-50 MCG/DOSE AEPB, Inhale 1 puff into the lungs 2 (two) times daily., Disp: 1 each, Rfl: 11 .  LORazepam (ATIVAN) 0.5 MG tablet, TAKE 1 TABLET EVERY 8 HOURS AS NEEDED, Disp:  60 tablet, Rfl: 0 .  meclizine (ANTIVERT) 25 MG tablet, TAKE 1 TABLET (25 MG TOTAL) BY MOUTH 3 (THREE) TIMES DAILY AS NEEDED FOR DIZZINESS., Disp: 30 tablet, Rfl: 2 .  Multiple Vitamins-Iron (MULTI-VITAMIN/IRON PO), Take 1 tablet by mouth daily., Disp: , Rfl:  .  nystatin cream (MYCOSTATIN), APPLY 1 APPLICATION TOPICALLY AT BEDTIME., Disp: 30 g, Rfl: 5 .  Risedronate Sodium 35 MG TBEC, Take 1 tablet (35 mg total) by mouth once a week. Can be taken with or without food.  Take with 4 ounces of liquid., Disp: 4 tablet, Rfl: 11 .  rOPINIRole (REQUIP) 0.25 MG tablet, TAKE 1 TABLET AT BEDTIME THEN CAN GO TO 2 TABLETS AT BEDTIME IF ONE TAB DOESNT HELP, Disp: 180 tablet, Rfl: 0 .  trimethoprim-polymyxin b (POLYTRIM) ophthalmic solution, PLACE 1 DROP INTO BOTH EYES EVERY 4 (FOUR) HOURS AS NEEDED, Disp: 10 mL, Rfl: 1 .  VENTOLIN HFA 108 (90 Base) MCG/ACT inhaler, INHALE 2 PUFFS INTO THE LUNGS EVERY 6 (SIX) HOURS AS NEEDED FOR WHEEZING OR SHORTNESS OF BREATH., Disp: 18 Inhaler, Rfl: 5 .  Vitamin D, Cholecalciferol, 1000 units TABS, Take 1,000 Units by mouth daily., Disp: 100 tablet, Rfl: 0 .  amoxicillin-clavulanate (AUGMENTIN) 875-125 MG tablet, Take 1 tablet by mouth 2 (two) times daily., Disp: 20 tablet, Rfl: 0 Continue all other maintenance medications as listed above.  Follow up plan: Return if symptoms worsen or fail to improve.  Educational handout given for survey  Remus Loffler PA-C [facility] [address]  [place], [place] [address] [phone]   10/19/2016, 3:28 PM

## 2016-10-20 LAB — URINE CULTURE

## 2016-10-21 ENCOUNTER — Encounter: Payer: Self-pay | Admitting: *Deleted

## 2016-11-04 ENCOUNTER — Other Ambulatory Visit: Payer: Self-pay | Admitting: Licensed Clinical Social Worker

## 2016-11-04 NOTE — Patient Outreach (Signed)
Assessment:  CSW spoke via phone with client. CSw verified cilent identity. CSW received verbal permission from client on 11/04/16 for CSW to speak with client about current client needs and status. CSW and client spoke of client needs.  Client sees Dr. Ermalinda May as primary care doctor. Client said she had her prescribed medications and was taking medications as prescribed. Client said she has been scheduled for an appointment with a kidney specialist in [place], [place] in October of 2018. Lindsey May said she has a friend who helps in transporting client to and from client's scheduled medical appointments.  Client does pay her monthly bills on time each month; but, client continues to have financial challenges.  CSW has talked with client about financial challenges of client and has talked with client about possible resources for client in the community. Client goes monthly to church food pantry in [place], [place] for food support. Client uses nebulizer machine for breathing treatments for client as prescribed. Client is attending medical appointments as scheduled with Dr. Ermalinda May. Client said that her Medicaid benefit does help her with some of her monthly medication costs. CSW and client spoke of client care plan. CSW encouraged client to attend all scheduled client medical appointments in the next 30 days.  Client said she struggles to buy cleaning supplies or household products.  She said she recently had to have repairs to air conditioning unit to her home. CSW encouraged Lindsey May to call CSW at [phone] as needed to discuss social work needs of client. Lindsey May was appreciative of call from CSW on 11/04/16.  Plan:  Client to attend all scheduled client medical appointments  in the next 30 days.  CSW to call client in 2 weeks to assess client needs at that time.  Lindsey May Lindsey May MSW, LCSW Licensed Clinical Social Worker [facility] [phone]

## 2016-11-12 ENCOUNTER — Ambulatory Visit (INDEPENDENT_AMBULATORY_CARE_PROVIDER_SITE_OTHER): Payer: Medicare Other | Admitting: Family Medicine

## 2016-11-12 ENCOUNTER — Encounter: Payer: Self-pay | Admitting: Family Medicine

## 2016-11-12 VITALS — BP 104/71 | HR 77 | Temp 97.5°F | Ht 65.0 in | Wt 141.2 lb

## 2016-11-12 DIAGNOSIS — F411 Generalized anxiety disorder: Secondary | ICD-10-CM

## 2016-11-12 DIAGNOSIS — J0101 Acute recurrent maxillary sinusitis: Secondary | ICD-10-CM | POA: Diagnosis not present

## 2016-11-12 DIAGNOSIS — R300 Dysuria: Secondary | ICD-10-CM | POA: Diagnosis not present

## 2016-11-12 DIAGNOSIS — J449 Chronic obstructive pulmonary disease, unspecified: Secondary | ICD-10-CM | POA: Diagnosis not present

## 2016-11-12 DIAGNOSIS — Z13220 Encounter for screening for lipoid disorders: Secondary | ICD-10-CM | POA: Diagnosis not present

## 2016-11-12 DIAGNOSIS — N3000 Acute cystitis without hematuria: Secondary | ICD-10-CM

## 2016-11-12 DIAGNOSIS — Z136 Encounter for screening for cardiovascular disorders: Secondary | ICD-10-CM | POA: Diagnosis not present

## 2016-11-12 LAB — URINALYSIS, COMPLETE
Bilirubin, UA: NEGATIVE
GLUCOSE, UA: NEGATIVE
KETONES UA: NEGATIVE
NITRITE UA: POSITIVE — AB
Protein, UA: NEGATIVE
RBC, UA: NEGATIVE
Specific Gravity, UA: 1.01 (ref 1.005–1.030)
UUROB: 0.2 mg/dL (ref 0.2–1.0)
pH, UA: 7 (ref 5.0–7.5)

## 2016-11-12 LAB — MICROSCOPIC EXAMINATION: Renal Epithel, UA: NONE SEEN /hpf

## 2016-11-12 MED ORDER — LORAZEPAM 0.5 MG PO TABS: 0.5000 mg | ORAL_TABLET | Freq: Three times a day (TID) | ORAL | 2 refills | Status: DC | PRN

## 2016-11-12 MED ORDER — TRIAMCINOLONE ACETONIDE 40 MG/ML IJ SUSP
40.0000 mg | Freq: Once | INTRAMUSCULAR | Status: AC
  Administered 2016-11-12: 40 mg via INTRAMUSCULAR

## 2016-11-12 MED ORDER — DOXYCYCLINE HYCLATE 100 MG PO TABS: 100.0000 mg | ORAL_TABLET | Freq: Two times a day (BID) | ORAL | 0 refills | Status: DC

## 2016-11-12 NOTE — Progress Notes (Signed)
   HPI  Patient presents today here for follow-up chronic medical conditions.  Anxiety Doing well, needs refill of Ativan. Reports stable symptoms. Also taking Cymbalta.  COPD Patient states that her breathing has not been quite right in about 2 weeks, she reports increased cough. This started after losing her air conditioning per weekend.  Facial pain and pressure Patient reports severe congestion and developing and worsening facial pain and pressure over the last 1 week or so. She has recently had sinusitis as well.  Dysuria Patient reports dysuria persistent, this has improved temporarily with antibiotics. She has had one out of 3 cultures with significant growth.  PMH: Smoking status noted ROS: Per HPI  Objective: BP 104/71   Pulse 77   Temp (!) 97.5 F (36.4 C) (Oral)   Ht  (1.651 m)   Wt 141 lb 3.2 oz (64 kg)   BMI 23.50 kg/m  Gen: NAD, alert, cooperative with exam HEENT: NCAT, oropharynx moist and clear, left-sided tenderness to palpation over the maxillary sinus CV: RRR, good S1/S2, no murmur Resp: CTABL, no wheezes, non-labored Ext: No edema, warm Neuro: Alert and oriented, No gross deficits  Assessment and plan:  # Generalized anxiety disorder Stable Refill Ativan, continue Cymbalta  # COPD Mild exacerbation, given doxycycline for possible UTI as well. Also given IM Kenalog  # Acute sinusitis Treat with doxycycline, Kenalog will also be helpful with severe symptoms  # UTI Treat with doxy Culture No signs of sepsis    Orders Placed This Encounter  Procedures  . Urinalysis, Complete    No orders of the defined types were placed in this encounter.   Murtis Sink, MD [facility] 11/12/2016, 8:12 AM

## 2016-11-12 NOTE — Patient Instructions (Signed)
Great to see you!  Be sure to start antibiotics today and finish the entire course.    Urinary Tract Infection, Adult A urinary tract infection (UTI) is an infection of any part of the urinary tract, which includes the kidneys, ureters, bladder, and urethra. These organs make, store, and get rid of urine in the body. UTI can be a bladder infection (cystitis) or kidney infection (pyelonephritis). What are the causes? This infection may be caused by fungi, viruses, or bacteria. Bacteria are the most common cause of UTIs. This condition can also be caused by repeated incomplete emptying of the bladder during urination. What increases the risk? This condition is more likely to develop if:  You ignore your need to urinate or hold urine for long periods of time.  You do not empty your bladder completely during urination.  You wipe back to front after urinating or having a bowel movement, if you are female.  You are uncircumcised, if you are female.  You are constipated.  You have a urinary catheter that stays in place (indwelling).  You have a weak defense (immune) system.  You have a medical condition that affects your bowels, kidneys, or bladder.  You have diabetes.  You take antibiotic medicines frequently or for long periods of time, and the antibiotics no longer work well against certain types of infections (antibiotic resistance).  You take medicines that irritate your urinary tract.  You are exposed to chemicals that irritate your urinary tract.  You are female.  What are the signs or symptoms? Symptoms of this condition include:  Fever.  Frequent urination or passing small amounts of urine frequently.  Needing to urinate urgently.  Pain or burning with urination.  Urine that smells bad or unusual.  Cloudy urine.  Pain in the lower abdomen or back.  Trouble urinating.  Blood in the urine.  Vomiting or being less hungry than normal.  Diarrhea or abdominal  pain.  Vaginal discharge, if you are female.  How is this diagnosed? This condition is diagnosed with a medical history and physical exam. You will also need to provide a urine sample to test your urine. Other tests may be done, including:  Blood tests.  Sexually transmitted disease (STD) testing.  If you have had more than one UTI, a cystoscopy or imaging studies may be done to determine the cause of the infections. How is this treated? Treatment for this condition often includes a combination of two or more of the following:  Antibiotic medicine.  Other medicines to treat less common causes of UTI.  Over-the-counter medicines to treat pain.  Drinking enough water to stay hydrated.  Follow these instructions at home:  Take over-the-counter and prescription medicines only as told by your health care provider.  If you were prescribed an antibiotic, take it as told by your health care provider. Do not stop taking the antibiotic even if you start to feel better.  Avoid alcohol, caffeine, tea, and carbonated beverages. They can irritate your bladder.  Drink enough fluid to keep your urine clear or pale yellow.  Keep all follow-up visits as told by your health care provider. This is important.  Make sure to: ? Empty your bladder often and completely. Do not hold urine for long periods of time. ? Empty your bladder before and after sex. ? Wipe from front to back after a bowel movement if you are female. Use each tissue one time when you wipe. Contact a health care provider if:  You  have back pain.  You have a fever.  You feel nauseous or vomit.  Your symptoms do not get better after 3 days.  Your symptoms go away and then return. Get help right away if:  You have severe back pain or lower abdominal pain.  You are vomiting and cannot keep down any medicines or water. This information is not intended to replace advice given to you by your health care provider. Make sure  you discuss any questions you have with your health care provider. Document Released: 11/25/2004 Document Revised: 07/30/2015 Document Reviewed: 01/06/2015 Elsevier Interactive Patient Education  2017 [employer].

## 2016-11-12 NOTE — Addendum Note (Signed)
Addended by: Angela Adam on: 11/12/2016 08:32 AM   Modules accepted: Orders

## 2016-11-13 LAB — CBC WITH DIFFERENTIAL/PLATELET
BASOS ABS: 0 10*3/uL (ref 0.0–0.2)
BASOS: 0 %
EOS (ABSOLUTE): 0.1 10*3/uL (ref 0.0–0.4)
EOS: 1 %
Hematocrit: 38.7 % (ref 34.0–46.6)
Hemoglobin: 13.2 g/dL (ref 11.1–15.9)
IMMATURE GRANULOCYTES: 0 %
Immature Grans (Abs): 0 10*3/uL (ref 0.0–0.1)
LYMPHS ABS: 2.1 10*3/uL (ref 0.7–3.1)
Lymphs: 25 %
MCH: 31.9 pg (ref 26.6–33.0)
MCHC: 34.1 g/dL (ref 31.5–35.7)
MCV: 94 fL (ref 79–97)
MONOS ABS: 0.4 10*3/uL (ref 0.1–0.9)
Monocytes: 4 %
Neutrophils Absolute: 5.7 10*3/uL (ref 1.4–7.0)
Neutrophils: 70 %
PLATELETS: 299 10*3/uL (ref 150–379)
RBC: 4.14 x10E6/uL (ref 3.77–5.28)
RDW: 13.8 % (ref 12.3–15.4)
WBC: 8.3 10*3/uL (ref 3.4–10.8)

## 2016-11-13 LAB — LIPID PANEL
CHOLESTEROL TOTAL: 133 mg/dL (ref 100–199)
Chol/HDL Ratio: 2.3 ratio (ref 0.0–4.4)
HDL: 57 mg/dL (ref >39)
LDL CALC: 59 mg/dL (ref 0–99)
TRIGLYCERIDES: 87 mg/dL (ref 0–149)
VLDL Cholesterol Cal: 17 mg/dL (ref 5–40)

## 2016-11-13 LAB — CMP14+EGFR
ALBUMIN: 4.1 g/dL (ref 3.6–4.8)
ALT: 14 IU/L (ref 0–32)
AST: 20 IU/L (ref 0–40)
Albumin/Globulin Ratio: 2 (ref 1.2–2.2)
Alkaline Phosphatase: 109 IU/L (ref 39–117)
BUN / CREAT RATIO: 14 (ref 12–28)
BUN: 12 mg/dL (ref 8–27)
Bilirubin Total: 0.2 mg/dL (ref 0.0–1.2)
CALCIUM: 9.5 mg/dL (ref 8.7–10.3)
CO2: 27 mmol/L (ref 20–29)
CREATININE: 0.84 mg/dL (ref 0.57–1.00)
Chloride: 96 mmol/L (ref 96–106)
GFR calc non Af Amer: 72 mL/min/{1.73_m2} (ref >59)
GFR, EST AFRICAN AMERICAN: 83 mL/min/{1.73_m2} (ref >59)
GLOBULIN, TOTAL: 2.1 g/dL (ref 1.5–4.5)
Glucose: 89 mg/dL (ref 65–99)
Potassium: 3.8 mmol/L (ref 3.5–5.2)
SODIUM: 138 mmol/L (ref 134–144)
Total Protein: 6.2 g/dL (ref 6.0–8.5)

## 2016-11-14 ENCOUNTER — Other Ambulatory Visit: Payer: Self-pay | Admitting: Family Medicine

## 2016-11-14 LAB — URINE CULTURE

## 2016-11-17 ENCOUNTER — Other Ambulatory Visit: Payer: Self-pay | Admitting: Licensed Clinical Social Worker

## 2016-11-17 NOTE — Patient Outreach (Signed)
Assessment:  CSW spoke via phone with client. CSW verified client identity. CSW received verbal permission from client on 11/17/16 for CSW to communicate with client about client's current needs and status. Client sees Dr. Ermalinda Memos as primary care doctor. Client said she had her prescribed medications and is taking medications as prescribed. Client said she is scheduled to see nephrologist in October of 2018. She is attending scheduled client medical appointments.  Client said she has a friend who helps in transpoting client to and from client's scheduled medical appointments. Client does pay her monthly bills on time each month; however, client still has ongoing financial struggles each month. Client contnues to go monthly to church food pantry in [place], [place] for food assistance. Client uses nebulizer as prescribed for breathing treatments for client. Client said that her Medicaid benefit does help her with some monthly medication costs for client. Client and CSW spoke of client care plan. CSW encouraged client to attend all scheduled client medical appointments in the next 30 days.Client said she had an appointment with Dr. Ermalinda Memos last week.  She said she is taking medications asa prescribed br Dr. Ermalinda Memos. She said she has a urinary tract infection and is taking medications as prescribed for urinary tract infection. She said she has difficulty buying cleaning supplies for her home. She said she would try to go to church food pantry in [place], [place] next week to seek food support. CSW thanked her for phone call with CSW on 11/17/16. CSW encouraged Marveen to call CSW at [phone] as needed for social work support. Debhora was appreciative of phone call from CSW on 11/17/16.   Plan:  Client to attend all scheduled client medical appointments in the next 30 days.    CSW to call client in 4 weeks to assess client needs at that time.  Kelton Pillar.Meron Bocchino MSW, LCSW Licensed Clinical Social Worker [facility]  Care Management [phone]

## 2016-11-18 ENCOUNTER — Ambulatory Visit: Payer: Self-pay | Admitting: Licensed Clinical Social Worker

## 2016-11-18 ENCOUNTER — Ambulatory Visit: Payer: Medicare Other | Admitting: *Deleted

## 2016-11-22 IMAGING — CR DG FOOT COMPLETE 3+V*R*
3 series · 3 of 3 positions shown · non-contrast
Comparison: None.

CLINICAL DATA: Injury 2 weeks ago.  Pain.

EXAM:
RIGHT FOOT COMPLETE - 3+ VIEW

[view not recorded (1 of 3)]
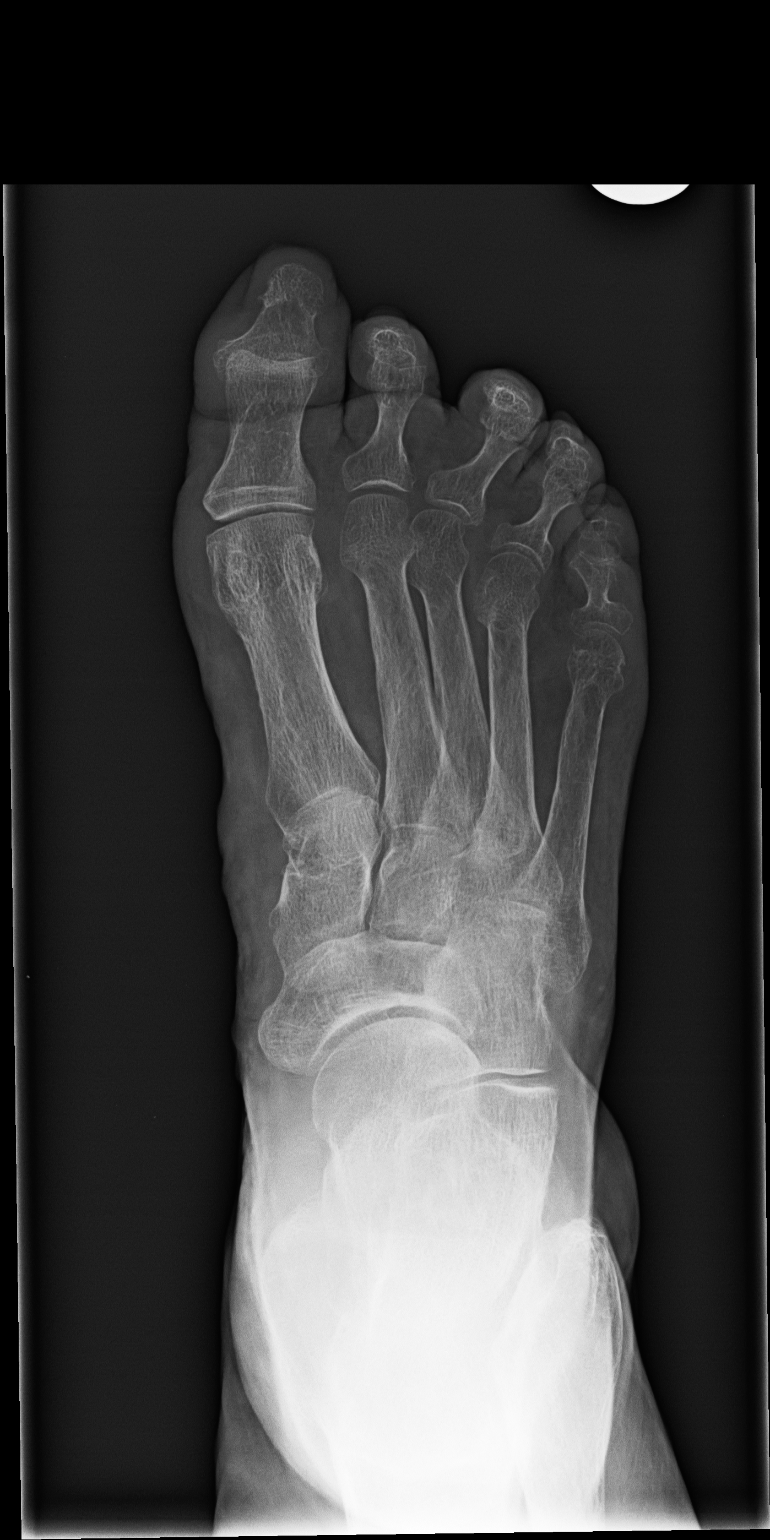

[view not recorded (2 of 3)]
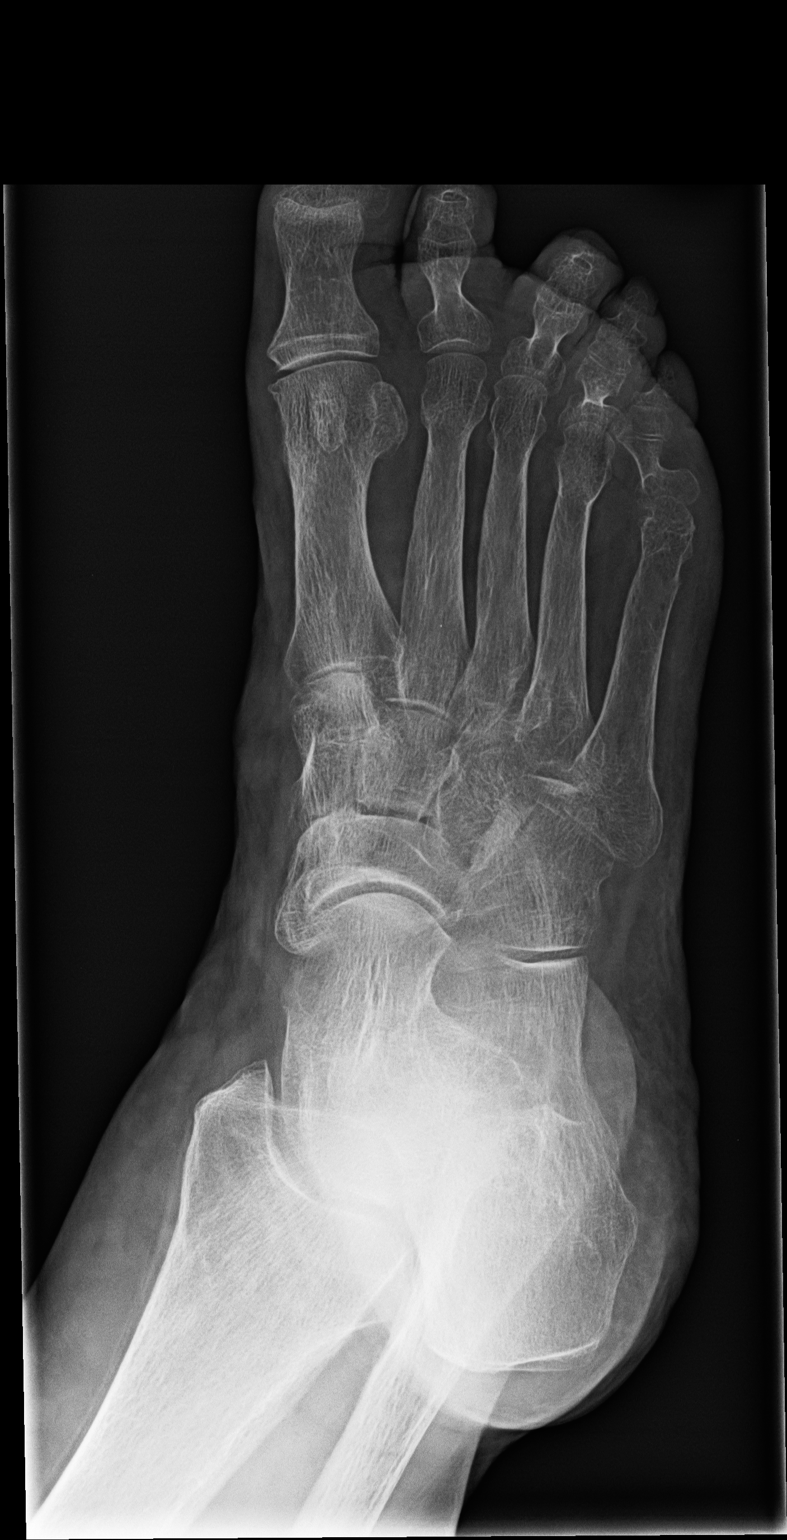

[view not recorded (3 of 3)]
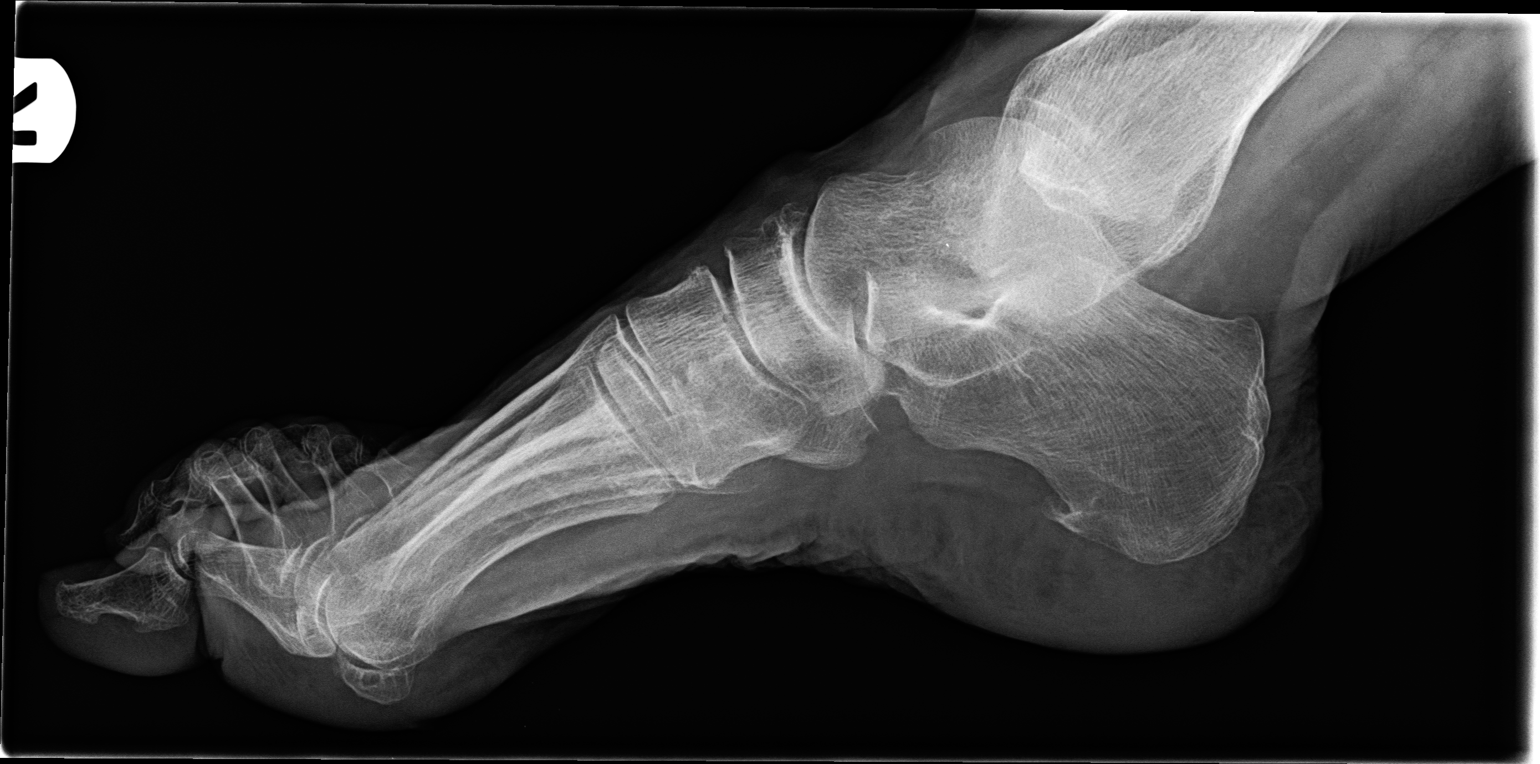

[3 of 3 positions shown; findings below may reference images not displayed]

FINDINGS: Diffuse osteopenia. Diffuse degenerative change. No evidence of
fracture dislocation.
IMPRESSION: Diffuse osteopenia degenerative change.  No acute abnormality.

## 2016-12-10 ENCOUNTER — Ambulatory Visit: Payer: Self-pay | Admitting: Urology

## 2016-12-13 ENCOUNTER — Encounter: Payer: Self-pay | Admitting: Family Medicine

## 2016-12-13 ENCOUNTER — Ambulatory Visit (INDEPENDENT_AMBULATORY_CARE_PROVIDER_SITE_OTHER): Payer: Medicare Other | Admitting: Family Medicine

## 2016-12-13 VITALS — BP 90/56 | HR 71 | Temp 97.4°F | Ht 65.0 in | Wt 139.4 lb

## 2016-12-13 DIAGNOSIS — S51811A Laceration without foreign body of right forearm, initial encounter: Secondary | ICD-10-CM | POA: Diagnosis not present

## 2016-12-13 DIAGNOSIS — J441 Chronic obstructive pulmonary disease with (acute) exacerbation: Secondary | ICD-10-CM

## 2016-12-13 DIAGNOSIS — R300 Dysuria: Secondary | ICD-10-CM

## 2016-12-13 LAB — CMP14+EGFR
A/G RATIO: 2.2 (ref 1.2–2.2)
ALK PHOS: 95 IU/L (ref 39–117)
ALT: 14 IU/L (ref 0–32)
AST: 15 IU/L (ref 0–40)
Albumin: 4.3 g/dL (ref 3.6–4.8)
BILIRUBIN TOTAL: 0.3 mg/dL (ref 0.0–1.2)
BUN/Creatinine Ratio: 14 (ref 12–28)
BUN: 11 mg/dL (ref 8–27)
CHLORIDE: 96 mmol/L (ref 96–106)
CO2: 26 mmol/L (ref 20–29)
Calcium: 9.4 mg/dL (ref 8.7–10.3)
Creatinine, Ser: 0.77 mg/dL (ref 0.57–1.00)
GFR calc Af Amer: 92 mL/min/{1.73_m2} (ref >59)
GFR calc non Af Amer: 80 mL/min/{1.73_m2} (ref >59)
GLUCOSE: 86 mg/dL (ref 65–99)
Globulin, Total: 2 g/dL (ref 1.5–4.5)
POTASSIUM: 3.8 mmol/L (ref 3.5–5.2)
Sodium: 137 mmol/L (ref 134–144)
Total Protein: 6.3 g/dL (ref 6.0–8.5)

## 2016-12-13 LAB — URINALYSIS, COMPLETE
BILIRUBIN UA: NEGATIVE
Glucose, UA: NEGATIVE
KETONES UA: NEGATIVE
Nitrite, UA: POSITIVE — AB
PROTEIN UA: NEGATIVE
RBC UA: NEGATIVE
SPEC GRAV UA: 1.015 (ref 1.005–1.030)
UUROB: 0.2 mg/dL (ref 0.2–1.0)
pH, UA: 7 (ref 5.0–7.5)

## 2016-12-13 LAB — CBC WITH DIFFERENTIAL/PLATELET
BASOS ABS: 0 10*3/uL (ref 0.0–0.2)
Basos: 0 %
EOS (ABSOLUTE): 0.1 10*3/uL (ref 0.0–0.4)
Eos: 1 %
HEMOGLOBIN: 13 g/dL (ref 11.1–15.9)
Hematocrit: 39.5 % (ref 34.0–46.6)
IMMATURE GRANS (ABS): 0 10*3/uL (ref 0.0–0.1)
IMMATURE GRANULOCYTES: 0 %
LYMPHS: 19 %
Lymphocytes Absolute: 1.5 10*3/uL (ref 0.7–3.1)
MCH: 31.6 pg (ref 26.6–33.0)
MCHC: 32.9 g/dL (ref 31.5–35.7)
MCV: 96 fL (ref 79–97)
Monocytes Absolute: 0.4 10*3/uL (ref 0.1–0.9)
Monocytes: 4 %
NEUTROS ABS: 6 10*3/uL (ref 1.4–7.0)
NEUTROS PCT: 76 %
PLATELETS: 341 10*3/uL (ref 150–379)
RBC: 4.11 x10E6/uL (ref 3.77–5.28)
RDW: 13.4 % (ref 12.3–15.4)
WBC: 8 10*3/uL (ref 3.4–10.8)

## 2016-12-13 LAB — MICROSCOPIC EXAMINATION

## 2016-12-13 MED ORDER — LEVOFLOXACIN 500 MG PO TABS: 500.0000 mg | ORAL_TABLET | Freq: Every day | ORAL | 0 refills | Status: DC
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
## 2016-12-13 NOTE — Patient Instructions (Signed)
Great to see you!  Take levaquin 1 pill once daily for 1 week.   We will call with your lab results or send them via letter or mychart.

## 2016-12-15 LAB — URINE CULTURE

## 2016-12-20 ENCOUNTER — Ambulatory Visit: Payer: Medicare Other | Admitting: Family Medicine

## 2016-12-20 ENCOUNTER — Other Ambulatory Visit: Payer: Self-pay | Admitting: Licensed Clinical Social Worker

## 2016-12-20 NOTE — Patient Outreach (Signed)
Assessment:  CSW spoke via phone with client. CSW verified client identity. CSW received verbal permission from client on 12/20/16 for CSW to speak with client about current client needs and status. Client sees Dr. Ermalinda MemosBradshaw as primary care doctor. Client said she had her prescribed medicatoins and is taking medications as prescribed. Client said that her recent appointment which was scheduled with nephrologist had been cancelled and would need to be rescheduled. Client said she has a friend who helps in transporting client  to and from client's scheduled medical appointments.  Client said she tries to pay her monthly bills on time each month; however, client  has ongoing financial struggles each month. She said that while she pays her monthly bills on time she has little financial resources left to help purchase household supplies or personal items.  Client said she does go each month to church food pantry in [place], [place] to receive food assistance.  Client said she uses nebulizer as prescribed for breathing  treatments for client .  Client said that her Medicaid benefit does help her with some monthly medication costs for client   CSW and client spoke of client care plan.  CSW encouraged client to attend all schedule client medical appointments in the next 30 days.   Client said she is taking medications as prescribed. Client said her utilities are working well and she has heat as needed for this time of the year. She said she was scheduled to take prescribed antibiotics for one more week and after completing these antibiotics prescribed she would have her next appointment with Dr. Ermalinda MemosBradshaw.  CSW thanked client for phone call with CSW on 12/20/16. CSW encouraged Lindsey May to call CSW at [phone] as needed to discuss social work needs of client. Client was appreciative of phone call from CSW on 12/20/16.   Plan:  Client to attend all scheduled client medical appointments s in the next 30 days.   CSW to  call client in 4 weeks to assess client needs at that time.  Lindsey PillarMichael S.Joahan May MSW, LCSW Licensed Clinical Social Worker [facility] Care Management [phone]

## 2016-12-22 ENCOUNTER — Other Ambulatory Visit: Payer: Self-pay | Admitting: Licensed Clinical Social Worker

## 2016-12-22 NOTE — Patient Outreach (Signed)
[facility] Care Management  [facility] Social Work  12/22/2016  Lindsey May 05-23-1950 [id number]  Subjective:    Objective:   Encounter Medications:  Outpatient Encounter Prescriptions as of 12/22/2016  Medication Sig  . albuterol (PROVENTIL) (2.5 MG/3ML) 0.083% nebulizer solution USE ONE VIAL IN NEBULIZER EVERY 6 HOURS AS NEEDED FOR WHEEZING OR SHORTNESS OF BREATH  . aspirin EC 81 MG tablet Take 1 tablet (81 mg total) by mouth daily.  . cetirizine (ZYRTEC) 10 MG tablet Take 1 tablet (10 mg total) by mouth daily.  . DULoxetine (CYMBALTA) 30 MG capsule TAKE 1 CAPSULE (30 MG TOTAL) BY MOUTH DAILY.  . fluticasone (FLONASE) 50 MCG/ACT nasal spray Place 2 sprays into both nostrils daily.  . Fluticasone-Salmeterol (ADVAIR DISKUS) 500-50 MCG/DOSE AEPB Inhale 1 puff into the lungs 2 (two) times daily.  Marland Kitchen levofloxacin (LEVAQUIN) 500 MG tablet Take 1 tablet (500 mg total) by mouth daily.  Marland Kitchen LORazepam (ATIVAN) 0.5 MG tablet Take 1 tablet (0.5 mg total) by mouth every 8 (eight) hours as needed.  . meclizine (ANTIVERT) 25 MG tablet TAKE 1 TABLET (25 MG TOTAL) BY MOUTH 3 (THREE) TIMES DAILY AS NEEDED FOR DIZZINESS.  . Multiple Vitamins-Iron (MULTI-VITAMIN/IRON PO) Take 1 tablet by mouth daily.  Marland Kitchen nystatin cream (MYCOSTATIN) APPLY 1 APPLICATION TOPICALLY AT BEDTIME.  Marland Kitchen Risedronate Sodium 35 MG TBEC Take 1 tablet (35 mg total) by mouth once a week. Can be taken with or without food.  Take with 4 ounces of liquid.  Marland Kitchen rOPINIRole (REQUIP) 0.25 MG tablet TAKE 1 TABLET AT BEDTIME THEN CAN GO TO 2 TABLETS AT BEDTIME IF ONE TAB DOESNT HELP  . trimethoprim-polymyxin b (POLYTRIM) ophthalmic solution PLACE 1 DROP INTO BOTH EYES EVERY 4 (FOUR) HOURS AS NEEDED  . VENTOLIN HFA 108 (90 Base) MCG/ACT inhaler INHALE 2 PUFFS INTO THE LUNGS EVERY 6 (SIX) HOURS AS NEEDED FOR WHEEZING OR SHORTNESS OF BREATH.  Marland Kitchen Vitamin D, Cholecalciferol, 1000 units TABS Take 1,000 Units by mouth daily.   No  facility-administered encounter medications on file as of 12/22/2016.     Functional Status:  No flowsheet data found.  Fall/Depression Screening:  PHQ 2/9 Scores 12/13/2016 11/12/2016 10/19/2016 09/21/2016 08/31/2016 07/07/2016 06/08/2016  PHQ - 2 Score 0 0 0 0 0 0 1  PHQ- 9 Score - - - - - - -    Assessment:   CSW traveled to home of client on 12/22/16 and met with client on 12/22/16 at home of client.  Client said she had her prescribed medications. Medications  recently have been expensive for her. She said she pays her monthly bills on time each month.  She has a friend who transports her to and from her medical appointments and to complete errands needed. She said she went to appointment at office of Dr. Wendi Snipes today. She was encouraged to continue to take her prescribed antibiotics. She said she eats several small meals daily. She said she had adequate food supply. She said that she goes periodically to church food pantry in [place], [place].  She said her utilities are now working well.  CSW provided client with some needed paper supplies for client.  She said she will go back to office of Dr. Wendi Snipes next week for appointment.  She said she is trying to attend scheduled client medical appointments.  Client said she is waiting to hear about date and time of her rescheduled kidney specialist appointment.  Client and client spoke of client care plan. CSW  encouraged client to attend scheduled client medical appointments in next 30 days. CSW thanked client for allowing CSW to visit her on 12/22/16 at home of client. Client was appreciative of [place] home visit with client on 12/22/16.    Plan:   Client to attend scheduled client medical appointments in next 30 days.  CSW to call client as scheduled to assess client needs.  Norva Riffle.Alberto Schoch MSW, LCSW Licensed Clinical Social Worker [facility] Care Management [phone]

## 2016-12-27 ENCOUNTER — Other Ambulatory Visit: Payer: Self-pay | Admitting: Family Medicine

## 2016-12-29 ENCOUNTER — Other Ambulatory Visit: Payer: Self-pay | Admitting: Family

## 2016-12-29 DIAGNOSIS — J0100 Acute maxillary sinusitis, unspecified: Secondary | ICD-10-CM

## 2016-12-29 DIAGNOSIS — J449 Chronic obstructive pulmonary disease, unspecified: Secondary | ICD-10-CM

## 2016-12-30 NOTE — Telephone Encounter (Signed)
Patient states she did not request it but aware ntbs

## 2017-01-13 ENCOUNTER — Other Ambulatory Visit: Payer: Self-pay | Admitting: Family Medicine

## 2017-01-17 ENCOUNTER — Encounter: Payer: Self-pay | Admitting: Family Medicine

## 2017-01-17 ENCOUNTER — Ambulatory Visit (INDEPENDENT_AMBULATORY_CARE_PROVIDER_SITE_OTHER): Payer: Medicare Other | Admitting: Family Medicine

## 2017-01-17 VITALS — BP 97/63 | HR 70 | Temp 96.7°F | Ht 65.0 in | Wt 140.2 lb

## 2017-01-17 DIAGNOSIS — I83813 Varicose veins of bilateral lower extremities with pain: Secondary | ICD-10-CM

## 2017-01-17 DIAGNOSIS — J0111 Acute recurrent frontal sinusitis: Secondary | ICD-10-CM | POA: Diagnosis not present

## 2017-01-17 DIAGNOSIS — R059 Cough, unspecified: Secondary | ICD-10-CM

## 2017-01-17 DIAGNOSIS — R300 Dysuria: Secondary | ICD-10-CM | POA: Diagnosis not present

## 2017-01-17 DIAGNOSIS — R05 Cough: Secondary | ICD-10-CM

## 2017-01-17 LAB — URINALYSIS, COMPLETE
Bilirubin, UA: NEGATIVE
GLUCOSE, UA: NEGATIVE
Ketones, UA: NEGATIVE
NITRITE UA: POSITIVE — AB
PH UA: 5.5 (ref 5.0–7.5)
Protein, UA: NEGATIVE
RBC, UA: NEGATIVE
SPEC GRAV UA: 1.01 (ref 1.005–1.030)
Urobilinogen, Ur: 0.2 mg/dL (ref 0.2–1.0)

## 2017-01-17 LAB — MICROSCOPIC EXAMINATION: RENAL EPITHEL UA: NONE SEEN /HPF

## 2017-01-17 MED ORDER — CEFDINIR 300 MG PO CAPS
300.0000 mg | ORAL_CAPSULE | Freq: Two times a day (BID) | ORAL | 0 refills | Status: DC
Start: 2017-01-17 — End: 2017-02-14

## 2017-01-17 MED ORDER — FLUCONAZOLE 150 MG PO TABS
ORAL_TABLET | ORAL | 0 refills | Status: DC
Start: 2017-01-17 — End: 2017-03-28

## 2017-01-17 MED ORDER — TRIAMCINOLONE ACETONIDE 40 MG/ML IJ SUSP
40.0000 mg | Freq: Once | INTRAMUSCULAR | Status: AC
  Administered 2017-01-17: 40 mg via INTRAMUSCULAR

## 2017-01-17 NOTE — Progress Notes (Signed)
   HPI  Patient presents today here with multiple complaints.  Patient has dysuria for about 3 days.  She has had multiple recent complaints of UTI with negative urine cultures. No fever, chills, sweats. Has appointment with urology next month.  Patient also complains of cough, congestion, facial pain for 3-4 days. Again no fever or chills.  Patient also complains of right greater than left foot and leg pain.  She has large varicose veins. She has never used compression stockings.  He reports good medication compliance with Advair   PMH: Smoking status noted ROS: Per HPI  Objective: BP 97/63   Pulse 70   Temp (!) 96.7 F (35.9 C) (Oral)   Ht 5\' 5"  (1.651 m)   Wt 140 lb 3.2 oz (63.6 kg)   BMI 23.33 kg/m  Gen: NAD, alert, cooperative with exam HEENT: NCAT, oropharynx moist and clear, tenderness to palpation of bilateral maxillary sinuses CV: RRR, good S1/S2, no murmur Resp: CTABL, no wheezes, non-labored Ext: 1+ tender edema bilaterally with large varicose veins Neuro: Alert and oriented, No gross deficits  Assessment and plan:  #Recurrent acute frontal sinusitis Omnicef given today, also Kenalog injection. I have also given her Diflucan once a week times 4 weeks in case she has a fungal component.   #Dysuria Unlikely true UTI with previous urine cultures negative. Omnicef should be good coverage if present  #Cough-possible COPD exacerbation Omnicef plus steroids. Reviewed preventative medications  #Varicose veins with bilateral leg pain. Compression stockings, consider vascular referral if not improving    Orders Placed This Encounter  Procedures  . Urine Culture  . Urinalysis, Complete    Meds ordered this encounter  Medications  . fluconazole (DIFLUCAN) 150 MG tablet    Sig: 1 po q week x 4 weeks    Dispense:  4 tablet    Refill:  0  . cefdinir (OMNICEF) 300 MG capsule    Sig: Take 1 capsule (300 mg total) 2 (two) times daily by mouth. 1 po BID      Dispense:  20 capsule    Refill:  0  . triamcinolone acetonide (KENALOG-40) injection 40 mg    Murtis SinkSam Syleena Mchan, MD [facility] 01/17/2017, 9:39 AM

## 2017-01-19 ENCOUNTER — Other Ambulatory Visit: Payer: Self-pay | Admitting: Licensed Clinical Social Worker

## 2017-01-19 NOTE — Patient Outreach (Signed)
Assessment:  CSW spoke via phone with Lindsey May. CSW verified identity of Lindsey May. CSW received verbal permission from client on 01/19/17 for CSW to speak with client about current client needs and status.  Lindsey May and CSW spoke of client needs. Client said she had her prescribed medications and was taking medications as prescribed. Client sees Dr. Ermalinda MemosBradshaw as primary care doctor. Client said she had an appointment with Dr. Ermalinda MemosBradshaw this past Monday.  Client said that Dr. Ermalinda MemosBradshaw had prescribed a new round of antibiotics for client.  She said she is attending medical appointments as scheduled. Client said she had a friend who transports client to and from client's scheduled medical appointments. She said she eats several small meals daily.  She said she had an adequate food supply. She said she goes periodically to church food pantry in [place], [place] for food assistance.  Client and CSW spoke of client care plan. CSW encouraged client to attend scheduled client medical appointments in next 30 days. CSW and client spoke of client's upcoming medical appointments. Client said she is scheduled to have an appointment with kidney specialist in December of 2018. She said she had adequate food supply.  Client said she had talked with Dr. Ermalinda MemosBradshaw recently about blood pressure readings for client. Client was encouraged by Dr. Ermalinda MemosBradshaw for client to  call RN at office of Dr. Ermalinda MemosBradshaw if client  had questions about blood presssure readings for client.   Client said she talks periodically with her son.  She said her son works a good deal of hours and thus it is difficult sometimes for her son to take her to medical appointments. She said she has a friend who continues to transport client to and from client's scheduled medical appointments.  CSW thanked client for phone call with CSW on 01/19/17. CSW encouraged client to call CSW at [phone] as needed to discuss social work needs of client.    Plan:  Client  to attend scheduled client medical appointments in next 30 days.  CSW to call client in 4 weeks to assess client needs at that time.  Kelton PillarMichael S.Liller Yohn MSW, LCSW Licensed Clinical Social Worker [facility] Care Management [phone]

## 2017-01-20 LAB — URINE CULTURE

## 2017-01-21 ENCOUNTER — Other Ambulatory Visit: Payer: Self-pay | Admitting: Family Medicine

## 2017-01-21 MED ORDER — CIPROFLOXACIN HCL 250 MG PO TABS: 250.0000 mg | ORAL_TABLET | Freq: Two times a day (BID) | ORAL | 0 refills | Status: DC

## 2017-01-21 NOTE — Progress Notes (Signed)
cipro

## 2017-02-03 ENCOUNTER — Telehealth: Payer: Self-pay | Admitting: Family Medicine

## 2017-02-03 NOTE — Telephone Encounter (Signed)
Patient aware.

## 2017-02-03 NOTE — Telephone Encounter (Signed)
They area available at [facility] for less than 30$, this may still not be affordable but is likely one of the better prices for having someone fit her.   Murtis SinkSam Bradshaw, MD [facility] 02/03/2017, 1:53 PM

## 2017-02-08 ENCOUNTER — Ambulatory Visit: Payer: Self-pay | Admitting: Urology

## 2017-02-09 ENCOUNTER — Other Ambulatory Visit: Payer: Self-pay | Admitting: Licensed Clinical Social Worker

## 2017-02-09 NOTE — Patient Outreach (Signed)
[facility]) Care Management  [facility] Social Work  02/09/2017  Lindsey May 07-23-1950 [id number]  Subjective:    Objective:   Encounter Medications:  Outpatient Encounter Medications as of 02/09/2017  Medication Sig  . albuterol (PROVENTIL) (2.5 MG/3ML) 0.083% nebulizer solution USE ONE VIAL IN NEBULIZER EVERY 6 HOURS AS NEEDED FOR WHEEZING OR SHORTNESS OF BREATH  . aspirin EC 81 MG tablet Take 1 tablet (81 mg total) by mouth daily.  . cefdinir (OMNICEF) 300 MG capsule Take 1 capsule (300 mg total) 2 (two) times daily by mouth. 1 po BID  . cetirizine (ZYRTEC) 10 MG tablet Take 1 tablet (10 mg total) by mouth daily.  . ciprofloxacin (CIPRO) 250 MG tablet Take 1 tablet (250 mg total) by mouth 2 (two) times daily.  . DULoxetine (CYMBALTA) 30 MG capsule TAKE 1 CAPSULE (30 MG TOTAL) BY MOUTH DAILY.  . fluconazole (DIFLUCAN) 150 MG tablet 1 po q week x 4 weeks  . fluticasone (FLONASE) 50 MCG/ACT nasal spray Place 2 sprays into both nostrils daily.  . Fluticasone-Salmeterol (ADVAIR DISKUS) 500-50 MCG/DOSE AEPB Inhale 1 puff into the lungs 2 (two) times daily.  Marland Kitchen levofloxacin (LEVAQUIN) 500 MG tablet Take 1 tablet (500 mg total) by mouth daily.  Marland Kitchen LORazepam (ATIVAN) 0.5 MG tablet Take 1 tablet (0.5 mg total) by mouth every 8 (eight) hours as needed.  . meclizine (ANTIVERT) 25 MG tablet TAKE 1 TABLET (25 MG TOTAL) BY MOUTH 3 (THREE) TIMES DAILY AS NEEDED FOR DIZZINESS.  . Multiple Vitamins-Iron (MULTI-VITAMIN/IRON PO) Take 1 tablet by mouth daily.  Marland Kitchen nystatin cream (MYCOSTATIN) APPLY 1 APPLICATION TOPICALLY AT BEDTIME.  Marland Kitchen Risedronate Sodium 35 MG TBEC Take 1 tablet (35 mg total) by mouth once a week. Can be taken with or without food.  Take with 4 ounces of liquid.  Marland Kitchen rOPINIRole (REQUIP) 0.25 MG tablet TAKE 1 TABLET AT BEDTIME THEN CAN GO TO 2 TABLETS AT BEDTIME IF ONE TAB DOESNT HELP  . trimethoprim-polymyxin b (POLYTRIM) ophthalmic solution PLACE 1 DROP INTO BOTH EYES EVERY 4  (FOUR) HOURS AS NEEDED  . VENTOLIN HFA 108 (90 Base) MCG/ACT inhaler INHALE 2 PUFFS INTO THE LUNGS EVERY 6 (SIX) HOURS AS NEEDED FOR WHEEZING OR SHORTNESS OF BREATH.  Marland Kitchen Vitamin D, Cholecalciferol, 1000 units TABS Take 1,000 Units by mouth daily.   No facility-administered encounter medications on file as of 02/09/2017.     Functional Status:  No flowsheet data found.  Fall/Depression Screening:  PHQ 2/9 Scores 01/17/2017 12/13/2016 11/12/2016 10/19/2016 09/21/2016 08/31/2016 07/07/2016  PHQ - 2 Score 0 0 0 0 0 0 0  PHQ- 9 Score - - - - - - -    Assessment:   CSW traveled to home of client on 02/09/17. CSW met with client at her home on 02/09/17. CSW gave client box of donated food items. Client was appreciative of food items received. Client sees Dr. Wendi Snipes as primary doctor. Client said she has appointment with Dr. Wendi Snipes next Monday.  She said she has her prescribed medications and is taking medications as prescribed.  She said she has periodic pain in her legs and she said she has talked with Dr. Wendi Snipes about pain in her legs.  She said her weight is stable. She said she checked with medical supply representative about compression stockings at [facility] [phone]). However, cost of these stockings were expensive and it will be hard for client to pay for these compression stockings.  She said that representative [facility]  will research less expensive alternative for her related to compression stockings.  She said she has adequate food supply.  She has a friend who sometimes takes her to and from food pantry in [place], [place].  She said she is out of the area to receive help from [facility]. CSW and client spoke of client care plan. CSW encouraged client to attend all scheduled client medical appointments in next 30 days. CSW thanked client for visiting with CSW on 02/09/17. Client was appreciative of CSW visit oin 02/09/17.  Plan:   Client to attend all scheduled  client medical appointments in next 30 days.  CSW to call client in 4 weeks to assess client needs.  Norva Riffle.Niza Soderholm MSW, LCSW Licensed Clinical Social Worker [facility] Care Management [phone]

## 2017-02-14 ENCOUNTER — Ambulatory Visit (INDEPENDENT_AMBULATORY_CARE_PROVIDER_SITE_OTHER): Payer: Medicare Other | Admitting: Family Medicine

## 2017-02-14 VITALS — BP 129/62 | HR 75 | Temp 97.1°F | Ht 65.0 in | Wt 138.6 lb

## 2017-02-14 DIAGNOSIS — L600 Ingrowing nail: Secondary | ICD-10-CM

## 2017-02-14 DIAGNOSIS — J320 Chronic maxillary sinusitis: Secondary | ICD-10-CM | POA: Diagnosis not present

## 2017-02-14 DIAGNOSIS — R300 Dysuria: Secondary | ICD-10-CM

## 2017-02-14 DIAGNOSIS — F411 Generalized anxiety disorder: Secondary | ICD-10-CM

## 2017-02-14 LAB — URINALYSIS, COMPLETE
BILIRUBIN UA: NEGATIVE
GLUCOSE, UA: NEGATIVE
KETONES UA: NEGATIVE
Leukocytes, UA: NEGATIVE
NITRITE UA: POSITIVE — AB
PROTEIN UA: NEGATIVE
RBC, UA: NEGATIVE
SPEC GRAV UA: 1.01 (ref 1.005–1.030)
UUROB: 0.2 mg/dL (ref 0.2–1.0)
pH, UA: 6.5 (ref 5.0–7.5)

## 2017-02-14 LAB — MICROSCOPIC EXAMINATION
RBC, UA: NONE SEEN /hpf (ref 0–?)
Renal Epithel, UA: NONE SEEN /hpf

## 2017-02-14 MED ORDER — CEFDINIR 300 MG PO CAPS: 300.0000 mg | ORAL_CAPSULE | Freq: Two times a day (BID) | ORAL | 0 refills | Status: DC

## 2017-02-14 MED ORDER — LORAZEPAM 0.5 MG PO TABS: 0.5000 mg | ORAL_TABLET | Freq: Three times a day (TID) | ORAL | 2 refills | Status: DC | PRN
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
## 2017-02-14 NOTE — Patient Instructions (Addendum)
Great to see you!  Come back in 3 months unless you need us sooner.    

## 2017-02-15 ENCOUNTER — Ambulatory Visit: Payer: Self-pay | Admitting: Licensed Clinical Social Worker

## 2017-02-17 LAB — URINE CULTURE

## 2017-02-23 ENCOUNTER — Other Ambulatory Visit: Payer: Self-pay | Admitting: Family Medicine

## 2017-03-05 ENCOUNTER — Other Ambulatory Visit: Payer: Self-pay | Admitting: Family Medicine

## 2017-03-10 ENCOUNTER — Other Ambulatory Visit: Payer: Self-pay | Admitting: Licensed Clinical Social Worker

## 2017-03-10 NOTE — Patient Outreach (Signed)
[facility]) Care Management  [facility] Social Work  03/10/2017  Lindsey May [birth year] [id number]  Subjective:    Objective:   Encounter Medications:  Outpatient Encounter Medications as of 03/10/2017  Medication Sig  . albuterol (PROVENTIL) (2.5 MG/3ML) 0.083% nebulizer solution USE ONE VIAL IN NEBULIZER EVERY 6 HOURS AS NEEDED FOR WHEEZING OR SHORTNESS OF BREATH  . aspirin EC 81 MG tablet Take 1 tablet (81 mg total) by mouth daily.  . cefdinir (OMNICEF) 300 MG capsule Take 1 capsule (300 mg total) by mouth 2 (two) times daily. 1 po BID  . cetirizine (ZYRTEC) 10 MG tablet Take 1 tablet (10 mg total) by mouth daily.  . ciprofloxacin (CIPRO) 250 MG tablet Take 1 tablet (250 mg total) by mouth 2 (two) times daily.  . DULoxetine (CYMBALTA) 30 MG capsule TAKE 1 CAPSULE (30 MG TOTAL) BY MOUTH DAILY.  . fluconazole (DIFLUCAN) 150 MG tablet 1 po q week x 4 weeks  . fluticasone (FLONASE) 50 MCG/ACT nasal spray Place 2 sprays into both nostrils daily.  . Fluticasone-Salmeterol (ADVAIR DISKUS) 500-50 MCG/DOSE AEPB Inhale 1 puff into the lungs 2 (two) times daily.  Marland Kitchen levofloxacin (LEVAQUIN) 500 MG tablet Take 1 tablet (500 mg total) by mouth daily.  Marland Kitchen LORazepam (ATIVAN) 0.5 MG tablet Take 1 tablet (0.5 mg total) by mouth every 8 (eight) hours as needed.  . meclizine (ANTIVERT) 25 MG tablet TAKE 1 TABLET (25 MG TOTAL) BY MOUTH 3 (THREE) TIMES DAILY AS NEEDED FOR DIZZINESS.  . Multiple Vitamins-Iron (MULTI-VITAMIN/IRON PO) Take 1 tablet by mouth daily.  Marland Kitchen nystatin cream (MYCOSTATIN) APPLY 1 APPLICATION TOPICALLY AT BEDTIME.  Marland Kitchen Risedronate Sodium 35 MG TBEC Take 1 tablet (35 mg total) by mouth once a week. Can be taken with or without food.  Take with 4 ounces of liquid.  Marland Kitchen rOPINIRole (REQUIP) 0.25 MG tablet TAKE 1 TABLET AT BEDTIME THEN CAN GO TO 2 TABLETS AT BEDTIME IF ONE TAB DOESNT HELP  . trimethoprim-polymyxin b (POLYTRIM) ophthalmic solution PLACE 1 DROP INTO BOTH EYES EVERY 4  (FOUR) HOURS AS NEEDED  . VENTOLIN HFA 108 (90 Base) MCG/ACT inhaler INHALE 2 PUFFS INTO THE LUNGS EVERY 6 (SIX) HOURS AS NEEDED FOR WHEEZING OR SHORTNESS OF BREATH.  Marland Kitchen Vitamin D, Cholecalciferol, 1000 units TABS Take 1,000 Units by mouth daily.   No facility-administered encounter medications on file as of 03/10/2017.     Functional Status:  No flowsheet data found.  Fall/Depression Screening:  PHQ 2/9 Scores 01/17/2017 12/13/2016 11/12/2016 10/19/2016 09/21/2016 08/31/2016 07/07/2016  PHQ - 2 Score 0 0 0 0 0 0 0  PHQ- 9 Score - - - - - - -    Assessment:   CSW traveled to home of client on 03/10/17.  CSW met with client on 03/10/17 at home of client net for a routine home visit. Client lives alone. She said she had her prescribed medications and is taking medications as prescribed. Client said she plans to have appointment with primary doctor later this month. Client is eating well but has some difficulty sleeping. She said she has to watch her sodium intake level. CSW took client some staple canned food items and some paper products needed. Client said she is on a budget and tries to pay her bills on time but often does not have extra money each month for some of her household items.  Client is attending scheduled medical appointments with Dr. Wendi Snipes.  Client said she has appointment in March of 2019  with kidney specialist.  Client does not take a prescribed pain medication.  CSW and client spoke of client care plan. CSW encouraged client to attend scheduled client medical appointments in next 30 days.  Client said that her friend who helped take her to and from food pantry has not been able to help her go back and forth to food pantry recently.  Client said she has limited family support. She said she watches her finances closely but has financial challenges each month.  CSW encouraged client to call CSW at [phone] as needed to discuss social work needs of client. CSW thanked client for allowing  CSW to visit her at her home on 03/10/17.   Plan:   Client to attend scheduled client medical appointments in next 30 days.   CSW to call client in 4 weeks to assess client needs.   Norva Riffle.Jessicalynn Deshong MSW, LCSW Licensed Clinical Social Worker [facility] Care Management [phone]

## 2017-03-15 ENCOUNTER — Ambulatory Visit: Payer: Self-pay | Admitting: Licensed Clinical Social Worker

## 2017-03-25 ENCOUNTER — Ambulatory Visit (INDEPENDENT_AMBULATORY_CARE_PROVIDER_SITE_OTHER): Payer: Medicare Other | Admitting: Physician Assistant

## 2017-03-25 ENCOUNTER — Encounter: Payer: Self-pay | Admitting: Physician Assistant

## 2017-03-25 VITALS — BP 127/67 | HR 82 | Temp 97.8°F | Ht 65.0 in | Wt 138.4 lb

## 2017-03-25 DIAGNOSIS — J0110 Acute frontal sinusitis, unspecified: Secondary | ICD-10-CM

## 2017-03-25 DIAGNOSIS — N3001 Acute cystitis with hematuria: Secondary | ICD-10-CM | POA: Diagnosis not present

## 2017-03-25 DIAGNOSIS — R300 Dysuria: Secondary | ICD-10-CM | POA: Diagnosis not present

## 2017-03-25 LAB — URINALYSIS, COMPLETE
Bilirubin, UA: NEGATIVE
Glucose, UA: NEGATIVE
Ketones, UA: NEGATIVE
NITRITE UA: POSITIVE — AB
PH UA: 7 (ref 5.0–7.5)
Specific Gravity, UA: 1.02 (ref 1.005–1.030)
Urobilinogen, Ur: 0.2 mg/dL (ref 0.2–1.0)

## 2017-03-25 LAB — MICROSCOPIC EXAMINATION
Renal Epithel, UA: NONE SEEN /hpf
WBC, UA: >30 /hpf — AB (ref 0–?)

## 2017-03-25 MED ORDER — AMOXICILLIN-POT CLAVULANATE 875-125 MG PO TABS: 1.0000 | ORAL_TABLET | Freq: Two times a day (BID) | ORAL | 0 refills | Status: DC

## 2017-03-27 LAB — URINE CULTURE

## 2017-03-28 NOTE — Progress Notes (Signed)
BP 127/67   Pulse 82   Temp 97.8 F (36.6 C) (Oral)   Ht 5\' 5"  (1.651 m)   Wt 138 lb 6.4 oz (62.8 kg)   BMI 23.03 kg/m    Subjective:    Patient ID: Lindsey May, female    DOB: 1951/02/25, 67 y.o.   MRN: [id number]  HPI: Lindsey May is a 67 y.o. female presenting on 03/25/2017 for Sinusitis and Back Pain  This patient has had many days of sinus headache and postnasal drainage. There is copious drainage at times. Denies any fever at this time. There has been a history of sinus infections in the past.  No history of sinus surgery. There is cough at night. It has become more prevalent in recent days. This patient has had several days of dysuria, frequency and nocturia. There is also pain over the bladder in the suprapubic region, no back pain. Denies leakage or hematuria.  Denies fever or chills. No pain in flank area.   Relevant past medical, surgical, family and social history reviewed and updated as indicated. Allergies and medications reviewed and updated.  Past Medical History:  Diagnosis Date  . Allergy   . Anxiety   . Arthritis   . COPD (chronic obstructive pulmonary disease) (HCC)   . Depression   . Fibromyalgia   . Osteoporosis   . Vertigo     Past Surgical History:  Procedure Laterality Date  . TUBAL LIGATION  1974    Review of Systems  Constitutional: Positive for chills and fatigue. Negative for activity change, appetite change and fever.  HENT: Positive for congestion, postnasal drip, sinus pressure, sinus pain and sore throat.   Eyes: Negative.   Respiratory: Positive for cough. Negative for wheezing.   Cardiovascular: Negative.  Negative for chest pain, palpitations and leg swelling.  Gastrointestinal: Negative.   Genitourinary: Positive for frequency and urgency. Negative for hematuria.  Musculoskeletal: Negative.   Skin: Negative.   Neurological: Positive for headaches.    Allergies as of 03/25/2017      Reactions   Zithromax [azithromycin]    Codeine Other (See Comments)      Medication List        Accurate as of 03/25/17 11:59 PM. Always use your most recent med list.          amoxicillin-clavulanate 875-125 MG tablet Commonly known as:  AUGMENTIN Take 1 tablet by mouth 2 (two) times daily.   aspirin EC 81 MG tablet Take 1 tablet (81 mg total) by mouth daily.   cefdinir 300 MG capsule Commonly known as:  OMNICEF Take 1 capsule (300 mg total) by mouth 2 (two) times daily. 1 po BID   cetirizine 10 MG tablet Commonly known as:  ZYRTEC Take 1 tablet (10 mg total) by mouth daily.   ciprofloxacin 250 MG tablet Commonly known as:  CIPRO Take 1 tablet (250 mg total) by mouth 2 (two) times daily.   DULoxetine 30 MG capsule Commonly known as:  CYMBALTA TAKE 1 CAPSULE (30 MG TOTAL) BY MOUTH DAILY.   fluticasone 50 MCG/ACT nasal spray Commonly known as:  FLONASE Place 2 sprays into both nostrils daily.   Fluticasone-Salmeterol 500-50 MCG/DOSE Aepb Commonly known as:  ADVAIR DISKUS Inhale 1 puff into the lungs 2 (two) times daily.   LORazepam 0.5 MG tablet Commonly known as:  ATIVAN Take 1 tablet (0.5 mg total) by mouth every 8 (eight) hours as needed.   meclizine 25 MG tablet Commonly known as:  ANTIVERT TAKE  1 TABLET (25 MG TOTAL) BY MOUTH 3 (THREE) TIMES DAILY AS NEEDED FOR DIZZINESS.   MULTI-VITAMIN/IRON PO Take 1 tablet by mouth daily.   nystatin cream Commonly known as:  MYCOSTATIN APPLY 1 APPLICATION TOPICALLY AT BEDTIME.   Risedronate Sodium 35 MG Tbec Take 1 tablet (35 mg total) by mouth once a week. Can be taken with or without food.  Take with 4 ounces of liquid.   rOPINIRole 0.25 MG tablet Commonly known as:  REQUIP TAKE 1 TABLET AT BEDTIME THEN CAN GO TO 2 TABLETS AT BEDTIME IF ONE TAB DOESNT HELP   trimethoprim-polymyxin b ophthalmic solution Commonly known as:  POLYTRIM PLACE 1 DROP INTO BOTH EYES EVERY 4 (FOUR) HOURS AS NEEDED   VENTOLIN HFA 108 (90 Base) MCG/ACT inhaler Generic  drug:  albuterol INHALE 2 PUFFS INTO THE LUNGS EVERY 6 (SIX) HOURS AS NEEDED FOR WHEEZING OR SHORTNESS OF BREATH.   albuterol (2.5 MG/3ML) 0.083% nebulizer solution Commonly known as:  PROVENTIL USE ONE VIAL IN NEBULIZER EVERY 6 HOURS AS NEEDED FOR WHEEZING OR SHORTNESS OF BREATH   Vitamin D (Cholecalciferol) 1000 units Tabs Take 1,000 Units by mouth daily.          Objective:    BP 127/67   Pulse 82   Temp 97.8 F (36.6 C) (Oral)   Ht 5\' 5"  (1.651 m)   Wt 138 lb 6.4 oz (62.8 kg)   BMI 23.03 kg/m   Allergies  Allergen Reactions  . Zithromax [Azithromycin]   . Codeine Other (See Comments)    Physical Exam  Constitutional: She is oriented to person, place, and time. She appears well-developed and well-nourished.  HENT:  Head: Normocephalic and atraumatic.  Right Ear: Tympanic membrane and external ear normal. No middle ear effusion.  Left Ear: Tympanic membrane and external ear normal.  No middle ear effusion.  Nose: Mucosal edema and rhinorrhea present. Right sinus exhibits no maxillary sinus tenderness. Left sinus exhibits no maxillary sinus tenderness.  Mouth/Throat: Uvula is midline. Posterior oropharyngeal erythema present.  Eyes: Conjunctivae and EOM are normal. Pupils are equal, round, and reactive to light. Right eye exhibits no discharge. Left eye exhibits no discharge.  Neck: Normal range of motion.  Cardiovascular: Normal rate, regular rhythm and normal heart sounds.  Pulmonary/Chest: Effort normal and breath sounds normal. No respiratory distress. She has no wheezes.  Abdominal: Soft. There is tenderness in the suprapubic area. There is no rebound.  Lymphadenopathy:    She has no cervical adenopathy.  Neurological: She is alert and oriented to person, place, and time.  Skin: Skin is warm and dry.  Psychiatric: She has a normal mood and affect.  Nursing note and vitals reviewed.   Results for orders placed or performed in visit on 03/25/17  Urine Culture    Result Value Ref Range   Urine Culture, Routine Final report (A)    Organism ID, Bacteria Escherichia coli (A)    ORGANISM ID, BACTERIA Comment    Antimicrobial Susceptibility Comment   Microscopic Examination  Result Value Ref Range   WBC, UA >30 (A) 0 - 5 /hpf   RBC, UA >30 (A) 0 - 2 /hpf   Epithelial Cells (non renal) 0-10 0 - 10 /hpf   Renal Epithel, UA None seen None seen /hpf   Mucus, UA Present Not Estab.   Bacteria, UA Many (A) None seen/Few  Urinalysis, Complete  Result Value Ref Range   Specific Gravity, UA 1.020 1.005 - 1.030   pH, UA  7.0 5.0 - 7.5   Color, UA Amber (A) Yellow   Appearance Ur Cloudy (A) Clear   Leukocytes, UA 3+ (A) Negative   Protein, UA 3+ (A) Negative/Trace   Glucose, UA Negative Negative   Ketones, UA Negative Negative   RBC, UA 3+ (A) Negative   Bilirubin, UA Negative Negative   Urobilinogen, Ur 0.2 0.2 - 1.0 mg/dL   Nitrite, UA Positive (A) Negative   Microscopic Examination See below:       Assessment & Plan:   1. Dysuria - Urine Culture - Urinalysis, Complete  2. Acute cystitis with hematuria - Microscopic Examination  3. Acute non-recurrent frontal sinusitis - amoxicillin-clavulanate (AUGMENTIN) 875-125 MG tablet; Take 1 tablet by mouth 2 (two) times daily.  Dispense: 20 tablet; Refill: 0    Current Outpatient Medications:  .  albuterol (PROVENTIL) (2.5 MG/3ML) 0.083% nebulizer solution, USE ONE VIAL IN NEBULIZER EVERY 6 HOURS AS NEEDED FOR WHEEZING OR SHORTNESS OF BREATH, Disp: 150 mL, Rfl: 2 .  amoxicillin-clavulanate (AUGMENTIN) 875-125 MG tablet, Take 1 tablet by mouth 2 (two) times daily., Disp: 20 tablet, Rfl: 0 .  aspirin EC 81 MG tablet, Take 1 tablet (81 mg total) by mouth daily., Disp: , Rfl:  .  cefdinir (OMNICEF) 300 MG capsule, Take 1 capsule (300 mg total) by mouth 2 (two) times daily. 1 po BID, Disp: 20 capsule, Rfl: 0 .  cetirizine (ZYRTEC) 10 MG tablet, Take 1 tablet (10 mg total) by mouth daily., Disp: 30  tablet, Rfl: 11 .  ciprofloxacin (CIPRO) 250 MG tablet, Take 1 tablet (250 mg total) by mouth 2 (two) times daily., Disp: 6 tablet, Rfl: 0 .  DULoxetine (CYMBALTA) 30 MG capsule, TAKE 1 CAPSULE (30 MG TOTAL) BY MOUTH DAILY., Disp: 30 capsule, Rfl: 2 .  fluticasone (FLONASE) 50 MCG/ACT nasal spray, Place 2 sprays into both nostrils daily., Disp: 16 g, Rfl: 5 .  Fluticasone-Salmeterol (ADVAIR DISKUS) 500-50 MCG/DOSE AEPB, Inhale 1 puff into the lungs 2 (two) times daily., Disp: 1 each, Rfl: 11 .  LORazepam (ATIVAN) 0.5 MG tablet, Take 1 tablet (0.5 mg total) by mouth every 8 (eight) hours as needed., Disp: 60 tablet, Rfl: 2 .  meclizine (ANTIVERT) 25 MG tablet, TAKE 1 TABLET (25 MG TOTAL) BY MOUTH 3 (THREE) TIMES DAILY AS NEEDED FOR DIZZINESS., Disp: 30 tablet, Rfl: 2 .  Multiple Vitamins-Iron (MULTI-VITAMIN/IRON PO), Take 1 tablet by mouth daily., Disp: , Rfl:  .  nystatin cream (MYCOSTATIN), APPLY 1 APPLICATION TOPICALLY AT BEDTIME., Disp: 30 g, Rfl: 2 .  Risedronate Sodium 35 MG TBEC, Take 1 tablet (35 mg total) by mouth once a week. Can be taken with or without food.  Take with 4 ounces of liquid., Disp: 4 tablet, Rfl: 11 .  rOPINIRole (REQUIP) 0.25 MG tablet, TAKE 1 TABLET AT BEDTIME THEN CAN GO TO 2 TABLETS AT BEDTIME IF ONE TAB DOESNT HELP, Disp: 180 tablet, Rfl: 0 .  trimethoprim-polymyxin b (POLYTRIM) ophthalmic solution, PLACE 1 DROP INTO BOTH EYES EVERY 4 (FOUR) HOURS AS NEEDED, Disp: 10 mL, Rfl: 1 .  VENTOLIN HFA 108 (90 Base) MCG/ACT inhaler, INHALE 2 PUFFS INTO THE LUNGS EVERY 6 (SIX) HOURS AS NEEDED FOR WHEEZING OR SHORTNESS OF BREATH., Disp: 18 Inhaler, Rfl: 5 .  Vitamin D, Cholecalciferol, 1000 units TABS, Take 1,000 Units by mouth daily., Disp: 100 tablet, Rfl: 0 Continue all other maintenance medications as listed above.  Follow up plan: No Follow-up on file.  Educational handout given for survey  Remus Loffler PA-C [facility] [address]   [place], [place] [address] [phone]   03/28/2017, 12:24 PM

## 2017-04-11 ENCOUNTER — Other Ambulatory Visit: Payer: Self-pay | Admitting: Family Medicine

## 2017-04-14 ENCOUNTER — Encounter: Payer: Self-pay | Admitting: Licensed Clinical Social Worker

## 2017-04-14 ENCOUNTER — Other Ambulatory Visit: Payer: Self-pay | Admitting: Licensed Clinical Social Worker

## 2017-04-14 NOTE — Patient Outreach (Signed)
Assessment:  CSW spoke via phone with client. CSW verified client identity. CSW received verbal permission from client on 04/14/17 for CSW to speak with client about client needs and status. Client sees Dr. Wendi Snipes as primary care doctor.  CSW informed client that CSW had been assisting client with [facility] CSW support for over one year. CSW has talked with client about her needs and discussed food resources for client. CSW has talked with client about transportation resources for client. CSW has talked with client about attending appointments with her primary care doctor.  CSW informed Lindsey May on 04/14/17 that client had met her care plan goals with CSW services. Thus, CSW informed Lindsey May that [place] would discharge client on 04/14/17 from [place] services since client had met her care plan goals with CSW services. Lindsey May agreed with this plan.  Lindsey May was appreciative of support she has received from Lindsey May services in recent months.  CSW encouraged client to continue to go to local food pantry to seek food resources of assistance. CSW encouraged client to plan ahead for transport needs to help client go to and from her medical appointments. Client said she is attending scheduled client medical appointments. CSW Lindsey May for meeting her care plan goals with [facility] CSW services.    Plan:  CSW is discharging Lindsey May from [facility] CSW services  on 04/14/17 since client has met her care plan goals with CSW services.  CSW to inform Lindsey May, Case Management Assistant, that Lindsey May discharged client on 04/14/17 from Lindsey May services.  CSW to fax physician case closure letter to Dr. Wendi Snipes informing Dr. Wendi Snipes that [place] discharged client on 04/14/17 from [place] services.  Lindsey May.Lindsey May MSW, LCSW Licensed Clinical Social Worker [facility] Care Management [phone]

## 2017-04-29 ENCOUNTER — Ambulatory Visit (INDEPENDENT_AMBULATORY_CARE_PROVIDER_SITE_OTHER): Payer: Medicare Other | Admitting: Family Medicine

## 2017-04-29 ENCOUNTER — Encounter: Payer: Self-pay | Admitting: Family Medicine

## 2017-04-29 VITALS — BP 115/71 | HR 76 | Temp 96.9°F | Ht 65.0 in | Wt 136.4 lb

## 2017-04-29 DIAGNOSIS — R52 Pain, unspecified: Secondary | ICD-10-CM | POA: Diagnosis not present

## 2017-04-29 DIAGNOSIS — G2581 Restless legs syndrome: Secondary | ICD-10-CM | POA: Diagnosis not present

## 2017-04-29 DIAGNOSIS — F411 Generalized anxiety disorder: Secondary | ICD-10-CM

## 2017-04-29 DIAGNOSIS — R300 Dysuria: Secondary | ICD-10-CM

## 2017-04-29 DIAGNOSIS — J320 Chronic maxillary sinusitis: Secondary | ICD-10-CM

## 2017-04-29 DIAGNOSIS — M545 Low back pain: Secondary | ICD-10-CM | POA: Diagnosis not present

## 2017-04-29 LAB — URINALYSIS, COMPLETE
Bilirubin, UA: NEGATIVE
GLUCOSE, UA: NEGATIVE
Ketones, UA: NEGATIVE
NITRITE UA: POSITIVE — AB
PH UA: 6 (ref 5.0–7.5)
PROTEIN UA: NEGATIVE
Specific Gravity, UA: 1.01 (ref 1.005–1.030)
UUROB: 0.2 mg/dL (ref 0.2–1.0)

## 2017-04-29 LAB — MICROSCOPIC EXAMINATION: RENAL EPITHEL UA: NONE SEEN /HPF

## 2017-04-29 LAB — VERITOR FLU A/B WAIVED
INFLUENZA A: NEGATIVE
INFLUENZA B: NEGATIVE

## 2017-04-29 MED ORDER — ROPINIROLE HCL 0.25 MG PO TABS: ORAL_TABLET | ORAL | 3 refills | Status: DC

## 2017-04-29 MED ORDER — AMOXICILLIN-POT CLAVULANATE 875-125 MG PO TABS
1.0000 | ORAL_TABLET | Freq: Two times a day (BID) | ORAL | 0 refills | Status: DC
Start: 2017-04-29 — End: 2017-05-31

## 2017-04-29 MED ORDER — LORAZEPAM 0.5 MG PO TABS: 0.5000 mg | ORAL_TABLET | Freq: Three times a day (TID) | ORAL | 2 refills | Status: DC | PRN

## 2017-04-29 NOTE — Progress Notes (Signed)
   HPI  Patient presents today here for routine follow up  PT state that RLS is helped by 1-2 pill of requip. She needs a refill.   Anxiety- doing well with ativan. Needs refill Sinusitis Pain again, had 3 weeks of relief with augmentin last month   Pt reports 3-4 days of chills., sbody aches in legs and back, and feeling ill.  Feels she may have flu.    PMH: Smoking status noted ROS: Per HPI  Objective: BP 115/71   Pulse 76   Temp (!) 96.9 F (36.1 C) (Oral)   Ht 5\' 5"  (1.651 m)   Wt 136 lb 6.4 oz (61.9 kg)   BMI 22.70 kg/m  Gen: NAD, alert, cooperative with exam HEENT: NCAT, + TTP BL Maxilary sinus CV: RRR, good S1/S2, no murmur Resp: CTABL, no wheezes, non-labored Ext: No edema, warm Neuro: Alert and oriented, No gross deficits MSK:  Mild TTP of BL paraspinal muscles in thoracic area.   Assessment and plan:  # RLS Doing well with requip, refilled  # Anxiety Doing well with cymbalt + Ativan, refilled ativan  # body aches, sinusitis, dysuria UA reasuring- culture pending Likely flu-like viral illness.,  Struggling with recurrent sinusitis, has seen ENT Augmentin     Orders Placed This Encounter  Procedures  . Urine Culture  . Urinalysis, Complete  . Veritor Flu A/B Waived    Order Specific Question:   Source    Answer:   nasal    Meds ordered this encounter  Medications  . LORazepam (ATIVAN) 0.5 MG tablet    Sig: Take 1 tablet (0.5 mg total) by mouth every 8 (eight) hours as needed.    Dispense:  60 tablet    Refill:  2    Not to exceed 5 additional fills before 03/13/2017.  Marland Kitchen. rOPINIRole (REQUIP) 0.25 MG tablet    Sig: 1-2 tabs at bedtime    Dispense:  180 tablet    Refill:  3  . amoxicillin-clavulanate (AUGMENTIN) 875-125 MG tablet    Sig: Take 1 tablet by mouth 2 (two) times daily.    Dispense:  20 tablet    Refill:  0    Murtis SinkSam Nelline Lio, MD [facility] 04/29/2017, 10:49 AM

## 2017-04-29 NOTE — Patient Instructions (Signed)
Great to see you!   

## 2017-04-30 LAB — URINE CULTURE

## 2017-05-31 ENCOUNTER — Ambulatory Visit (INDEPENDENT_AMBULATORY_CARE_PROVIDER_SITE_OTHER): Payer: Medicare Other | Admitting: Family Medicine

## 2017-05-31 ENCOUNTER — Ambulatory Visit (INDEPENDENT_AMBULATORY_CARE_PROVIDER_SITE_OTHER): Payer: Medicare Other

## 2017-05-31 ENCOUNTER — Encounter: Payer: Self-pay | Admitting: Family Medicine

## 2017-05-31 VITALS — BP 120/67 | HR 73 | Temp 97.1°F | Ht 65.0 in | Wt 134.4 lb

## 2017-05-31 DIAGNOSIS — H5711 Ocular pain, right eye: Secondary | ICD-10-CM

## 2017-05-31 DIAGNOSIS — M79671 Pain in right foot: Secondary | ICD-10-CM

## 2017-05-31 DIAGNOSIS — E559 Vitamin D deficiency, unspecified: Secondary | ICD-10-CM

## 2017-05-31 MED ORDER — METHYLPREDNISOLONE ACETATE 80 MG/ML IJ SUSP
80.0000 mg | Freq: Once | INTRAMUSCULAR | Status: AC
  Administered 2017-05-31: 80 mg via INTRAMUSCULAR

## 2017-05-31 NOTE — Patient Instructions (Signed)
Great to see you!  Try ice 15 minutes 4-6 times daily for your foot.   I would recommend seeing an eye doctor to look at your eye.

## 2017-05-31 NOTE — Progress Notes (Signed)
   HPI  Patient presents today here with right foot pain, also complains of right eye pain and mattering.  Patient explains she has had right foot pain for 4-5 days. States that it is burning pain along the lateral edge that radiates out to her toes. She denies any new back pain. She denies any trauma. She has been using warm soaks without improvement. She is also tried over-the-counter medications without improvement.  Patient also states that for the last week or so she has had right eye pain, right eye mattering in the morning, and a "film over her eye". We recommended that she see an eye doctor this morning, no appointment was given and she states that she cannot go, however will call them for an appointment.   PMH: Smoking status noted ROS: Per HPI  Objective: BP 120/67   Pulse 73   Temp (!) 97.1 F (36.2 C) (Oral)   Ht 5\' 5"  (1.651 m)   Wt 134 lb 6.4 oz (61 kg)   BMI 22.37 kg/m  Gen: NAD, alert, cooperative with exam HEENT: NCAT, no significant conjunctival injection of the eye on the right side, no abnormal texture of the eye, patient does report tenderness of the globe on palpation CV: RRR, good S1/S2, no murmur Resp: CTABL, no wheezes, non-labored Ext: No edema, warm Neuro: Alert and oriented, No gross deficits Msk:  Is to palpation of the right fifth metatarsal and the proximal end and throughout,   Assessment and plan:  #Right foot pain Unusual pain, neuropathic in nature Given IM Depo-Medrol Given tenderness of the proximal fifth metatarsal we have proceeded with plain film, the radiology read is pending  #eye pain Patient with mattering and pain of the eye. She does not have significant enough conjunctivitis for this to be simple pinkeye Recommended specialist evaluation, she will follow-up with them.  They have been contacted and are expecting her  #vitamin D deficiency Discussed usual transition from 50,000 units weekly to 2000 units once  daily  Orders Placed This Encounter  Procedures  . DG Foot Complete Right    Standing Status:   Future    Number of Occurrences:   1    Standing Expiration Date:   08/01/2018    Order Specific Question:   Reason for Exam (SYMPTOM  OR DIAGNOSIS REQUIRED)    Answer:   R foot pain    Order Specific Question:   Preferred imaging location?    Answer:   Internal    Order Specific Question:   Radiology Contrast Protocol - do NOT remove file path    Answer:   [email or web address]    Meds ordered this encounter  Medications  . methylPREDNISolone acetate (DEPO-MEDROL) injection 80 mg    Murtis SinkSam Aesha Agrawal, MD [facility] 05/31/2017, 9:04 AM

## 2017-06-09 ENCOUNTER — Other Ambulatory Visit: Payer: Self-pay | Admitting: Family Medicine

## 2017-06-16 ENCOUNTER — Ambulatory Visit (INDEPENDENT_AMBULATORY_CARE_PROVIDER_SITE_OTHER): Payer: Medicare Other | Admitting: Family Medicine

## 2017-06-16 ENCOUNTER — Encounter: Payer: Self-pay | Admitting: Family Medicine

## 2017-06-16 VITALS — BP 117/70 | HR 72 | Temp 97.0°F | Ht 65.0 in | Wt 132.8 lb

## 2017-06-16 DIAGNOSIS — N3000 Acute cystitis without hematuria: Secondary | ICD-10-CM | POA: Diagnosis not present

## 2017-06-16 DIAGNOSIS — R300 Dysuria: Secondary | ICD-10-CM | POA: Diagnosis not present

## 2017-06-16 DIAGNOSIS — S63105D Unspecified dislocation of left thumb, subsequent encounter: Secondary | ICD-10-CM

## 2017-06-16 LAB — URINALYSIS, COMPLETE
BILIRUBIN UA: NEGATIVE
Glucose, UA: NEGATIVE
KETONES UA: NEGATIVE
Nitrite, UA: NEGATIVE
PROTEIN UA: NEGATIVE
SPEC GRAV UA: 1.015 (ref 1.005–1.030)
Urobilinogen, Ur: 0.2 mg/dL (ref 0.2–1.0)
pH, UA: 7 (ref 5.0–7.5)

## 2017-06-16 LAB — MICROSCOPIC EXAMINATION: Renal Epithel, UA: NONE SEEN /hpf

## 2017-06-16 MED ORDER — CEFDINIR 300 MG PO CAPS: 300.0000 mg | ORAL_CAPSULE | Freq: Two times a day (BID) | ORAL | 0 refills | Status: DC

## 2017-06-16 NOTE — Progress Notes (Signed)
   HPI  Patient presents today for concern of UTI.  Patient explains she has had dysuria for 4 days.  She also risks reports new onset low back pain, suprapubic tenderness and pressure, foul-smelling urine, cloudy urine, and intermittent nausea.  She is tolerating foods fluids like usual.  She denies fever, chills, sweats.  She is also having more rhinorrhea than usual  PMH: Smoking status noted ROS: Per HPI  Objective: BP 117/70   Pulse 72   Temp (!) 97 F (36.1 C) (Oral)   Ht 5\' 5"  (1.651 m)   Wt 132 lb 12.8 oz (60.2 kg)   BMI 22.10 kg/m  Gen: NAD, alert, cooperative with exam HEENT: NCAT CV: RRR, good S1/S2, no murmur Resp: CTABL, no wheezes, non-labored Abd:  no CVA tenderness, mild suprapubic tenderness without guarding Ext: No edema, warm Neuro: Alert and oriented, No gross deficits MSK  Assessment and plan:  # UTI Tx with omnicef Culture No Signs of Pyelo RTC with any concerns  Left thumb dislocation - chronic Helped by bracing, she received a brace over 1 year ago and has now worn it out.  She states that it was so tattered she had to throw it out. Given new brace today   Orders Placed This Encounter  Procedures  . Urine Culture  . Urinalysis, Complete    Meds ordered this encounter  Medications  . cefdinir (OMNICEF) 300 MG capsule    Sig: Take 1 capsule (300 mg total) by mouth 2 (two) times daily. 1 po BID    Dispense:  14 capsule    Refill:  0    Murtis SinkSam Oval Cavazos, MD [facility] 06/16/2017, 8:23 AM

## 2017-06-16 NOTE — Patient Instructions (Signed)
Great to see you!   Urinary Tract Infection, Adult A urinary tract infection (UTI) is an infection of any part of the urinary tract, which includes the kidneys, ureters, bladder, and urethra. These organs make, store, and get rid of urine in the body. UTI can be a bladder infection (cystitis) or kidney infection (pyelonephritis). What are the causes? This infection may be caused by fungi, viruses, or bacteria. Bacteria are the most common cause of UTIs. This condition can also be caused by repeated incomplete emptying of the bladder during urination. What increases the risk? This condition is more likely to develop if:  You ignore your need to urinate or hold urine for long periods of time.  You do not empty your bladder completely during urination.  You wipe back to front after urinating or having a bowel movement, if you are female.  You are uncircumcised, if you are female.  You are constipated.  You have a urinary catheter that stays in place (indwelling).  You have a weak defense (immune) system.  You have a medical condition that affects your bowels, kidneys, or bladder.  You have diabetes.  You take antibiotic medicines frequently or for long periods of time, and the antibiotics no longer work well against certain types of infections (antibiotic resistance).  You take medicines that irritate your urinary tract.  You are exposed to chemicals that irritate your urinary tract.  You are female.  What are the signs or symptoms? Symptoms of this condition include:  Fever.  Frequent urination or passing small amounts of urine frequently.  Needing to urinate urgently.  Pain or burning with urination.  Urine that smells bad or unusual.  Cloudy urine.  Pain in the lower abdomen or back.  Trouble urinating.  Blood in the urine.  Vomiting or being less hungry than normal.  Diarrhea or abdominal pain.  Vaginal discharge, if you are female.  How is this  diagnosed? This condition is diagnosed with a medical history and physical exam. You will also need to provide a urine sample to test your urine. Other tests may be done, including:  Blood tests.  Sexually transmitted disease (STD) testing.  If you have had more than one UTI, a cystoscopy or imaging studies may be done to determine the cause of the infections. How is this treated? Treatment for this condition often includes a combination of two or more of the following:  Antibiotic medicine.  Other medicines to treat less common causes of UTI.  Over-the-counter medicines to treat pain.  Drinking enough water to stay hydrated.  Follow these instructions at home:  Take over-the-counter and prescription medicines only as told by your health care provider.  If you were prescribed an antibiotic, take it as told by your health care provider. Do not stop taking the antibiotic even if you start to feel better.  Avoid alcohol, caffeine, tea, and carbonated beverages. They can irritate your bladder.  Drink enough fluid to keep your urine clear or pale yellow.  Keep all follow-up visits as told by your health care provider. This is important.  Make sure to: ? Empty your bladder often and completely. Do not hold urine for long periods of time. ? Empty your bladder before and after sex. ? Wipe from front to back after a bowel movement if you are female. Use each tissue one time when you wipe. Contact a health care provider if:  You have back pain.  You have a fever.  You feel nauseous or   vomit.  Your symptoms do not get better after 3 days.  Your symptoms go away and then return. Get help right away if:  You have severe back pain or lower abdominal pain.  You are vomiting and cannot keep down any medicines or water. This information is not intended to replace advice given to you by your health care provider. Make sure you discuss any questions you have with your health care  provider. Document Released: 11/25/2004 Document Revised: 07/30/2015 Document Reviewed: 01/06/2015 Elsevier Interactive Patient Education  2018 Elsevier Inc.  

## 2017-06-19 LAB — URINE CULTURE

## 2017-06-25 ENCOUNTER — Other Ambulatory Visit: Payer: Self-pay | Admitting: Family Medicine

## 2017-07-05 ENCOUNTER — Ambulatory Visit (INDEPENDENT_AMBULATORY_CARE_PROVIDER_SITE_OTHER): Payer: Medicare Other | Admitting: Physician Assistant

## 2017-07-05 ENCOUNTER — Encounter: Payer: Self-pay | Admitting: Physician Assistant

## 2017-07-05 VITALS — BP 105/68 | HR 72 | Temp 98.1°F | Ht 65.0 in | Wt 133.2 lb

## 2017-07-05 DIAGNOSIS — J209 Acute bronchitis, unspecified: Secondary | ICD-10-CM

## 2017-07-05 MED ORDER — NEOMYCIN-COLIST-HC-THONZONIUM 3.3-3-10-0.5 MG/ML OT SUSP: 3.0000 [drp] | Freq: Three times a day (TID) | OTIC | 1 refills | Status: DC

## 2017-07-05 MED ORDER — AMOXICILLIN-POT CLAVULANATE 875-125 MG PO TABS: 1.0000 | ORAL_TABLET | Freq: Two times a day (BID) | ORAL | 0 refills | Status: DC

## 2017-07-05 NOTE — Patient Instructions (Signed)
In a few days you may receive a survey in the mail or online from [employer] regarding your visit with us today. Please take a moment to fill this out. Your feedback is very important to our whole office. It can help us better understand your needs as well as improve your experience and satisfaction. Thank you for taking your time to complete it. We care about you.  Jazariah Teall, PA-C  

## 2017-07-05 NOTE — Progress Notes (Signed)
BP 105/68   Pulse 72   Temp 98.1 F (36.7 C) (Oral)   Ht  (1.651 m)   Wt 133 lb 3.2 oz (60.4 kg)   BMI 22.17 kg/m    Subjective:    Patient ID: Lindsey May, female    DOB: [birth year], 67 y.o.   MRN: [id number]  HPI: Lindsey May is a 66 y.o. female presenting on 07/05/2017 for Cough; Ear Pain; and Generalized Body Aches  Patient with several days of progressing upper respiratory and bronchial symptoms. Initially there was more upper respiratory congestion. This progressed to having significant cough that is productive throughout the day and severe at night. There is occasional wheezing after coughing. Sometimes there is slight dyspnea on exertion. It is productive mucus that is yellow in color. Denies any blood.   Past Medical History:  Diagnosis Date  . Allergy   . Anxiety   . Arthritis   . COPD (chronic obstructive pulmonary disease) (HCC)   . Depression   . Fibromyalgia   . Osteoporosis   . Vertigo    Relevant past medical, surgical, family and social history reviewed and updated as indicated. Interim medical history since our last visit reviewed. Allergies and medications reviewed and updated. DATA REVIEWED: CHART IN EPIC  Family History reviewed for pertinent findings.  Review of Systems  Constitutional: Positive for chills and fatigue. Negative for activity change, appetite change and fever.  HENT: Positive for congestion, postnasal drip and sore throat.   Eyes: Negative.   Respiratory: Positive for cough and wheezing. Negative for shortness of breath.   Cardiovascular: Negative.  Negative for chest pain, palpitations and leg swelling.  Gastrointestinal: Negative.   Genitourinary: Negative.   Musculoskeletal: Negative.   Skin: Negative.   Neurological: Positive for headaches.    Allergies as of 07/05/2017      Reactions   Zithromax [azithromycin]    Codeine Other (See Comments)      Medication List        Accurate as of 07/05/17  1:18 PM. Always use  your most recent med list.          amoxicillin-clavulanate 875-125 MG tablet Commonly known as:  AUGMENTIN Take 1 tablet by mouth 2 (two) times daily.   aspirin EC 81 MG tablet Take 1 tablet (81 mg total) by mouth daily.   DULoxetine 30 MG capsule Commonly known as:  CYMBALTA TAKE 1 CAPSULE (30 MG TOTAL) BY MOUTH DAILY.   fluticasone 50 MCG/ACT nasal spray Commonly known as:  FLONASE Place 2 sprays into both nostrils daily.   Fluticasone-Salmeterol 500-50 MCG/DOSE Aepb Commonly known as:  ADVAIR DISKUS Inhale 1 puff into the lungs 2 (two) times daily.   LORazepam 0.5 MG tablet Commonly known as:  ATIVAN Take 1 tablet (0.5 mg total) by mouth every 8 (eight) hours as needed.   meclizine 25 MG tablet Commonly known as:  ANTIVERT TAKE 1 TABLET (25 MG TOTAL) BY MOUTH 3 (THREE) TIMES DAILY AS NEEDED FOR DIZZINESS.   MULTI-VITAMIN/IRON PO Take 1 tablet by mouth daily.   neomycin-colistin-hydrocortisone-thonzonium 3.05-01-08-0.5 MG/ML OTIC suspension Commonly known as:  CORTISPORIN-TC Place 3 drops into the right ear 3 (three) times daily.   nystatin cream Commonly known as:  MYCOSTATIN APPLY 1 APPLICATION TOPICALLY AT BEDTIME.   rOPINIRole 0.25 MG tablet Commonly known as:  REQUIP 1-2 tabs at bedtime   trimethoprim-polymyxin b ophthalmic solution Commonly known as:  POLYTRIM PLACE 1 DROP INTO BOTH EYES EVERY 4 (FOUR) HOURS AS NEEDED  VENTOLIN HFA 108 (90 Base) MCG/ACT inhaler Generic drug:  albuterol INHALE 2 PUFFS INTO THE LUNGS EVERY 6 (SIX) HOURS AS NEEDED FOR WHEEZING OR SHORTNESS OF BREATH.   albuterol (2.5 MG/3ML) 0.083% nebulizer solution Commonly known as:  PROVENTIL USE ONE VIAL IN NEBULIZER EVERY 6 HOURS AS NEEDED FOR WHEEZING OR SHORTNESS OF BREATH   Vitamin D (Cholecalciferol) 1000 units Tabs Take 1,000 Units by mouth daily.          Objective:    BP 105/68   Pulse 72   Temp 98.1 F (36.7 C) (Oral)   Ht  (1.651 m)   Wt 133 lb 3.2 oz  (60.4 kg)   BMI 22.17 kg/m   Allergies  Allergen Reactions  . Zithromax [Azithromycin]   . Codeine Other (See Comments)    Wt Readings from Last 3 Encounters:  07/05/17 133 lb 3.2 oz (60.4 kg)  06/16/17 132 lb 12.8 oz (60.2 kg)  05/31/17 134 lb 6.4 oz (61 kg)    Physical Exam  Constitutional: She is oriented to person, place, and time. She appears well-developed and well-nourished.  HENT:  Head: Normocephalic and atraumatic.  Right Ear: There is drainage and tenderness.  Left Ear: There is drainage and tenderness.  Nose: Mucosal edema and rhinorrhea present. Right sinus exhibits no maxillary sinus tenderness and no frontal sinus tenderness. Left sinus exhibits no maxillary sinus tenderness and no frontal sinus tenderness.  Mouth/Throat: Oropharyngeal exudate and posterior oropharyngeal erythema present.  Eyes: Pupils are equal, round, and reactive to light. Conjunctivae and EOM are normal.  Neck: Normal range of motion. Neck supple.  Cardiovascular: Normal rate, regular rhythm, normal heart sounds and intact distal pulses.  Pulmonary/Chest: Effort normal. She has wheezes in the right upper field and the left upper field.  Abdominal: Soft. Bowel sounds are normal.  Neurological: She is alert and oriented to person, place, and time. She has normal reflexes.  Skin: Skin is warm and dry. No rash noted.  Psychiatric: She has a normal mood and affect. Her behavior is normal. Judgment and thought content normal.  Nursing note and vitals reviewed.       Assessment & Plan:   1. Bronchitis, acute, with bronchospasm - amoxicillin-clavulanate (AUGMENTIN) 875-125 MG tablet; Take 1 tablet by mouth 2 (two) times daily.  Dispense: 20 tablet; Refill: 0   Continue all other maintenance medications as listed above.  Follow up plan: No follow-ups on file.  Educational handout given for survey  Remus Loffler PA-C [facility] [address]  [place], [place]  [address] [phone]   07/05/2017, 1:18 PM

## 2017-07-06 ENCOUNTER — Other Ambulatory Visit: Payer: Self-pay | Admitting: Physician Assistant

## 2017-07-06 ENCOUNTER — Other Ambulatory Visit: Payer: Self-pay | Admitting: Family Medicine

## 2017-07-07 MED ORDER — OFLOXACIN 0.3 % OT SOLN: 5.0000 [drp] | Freq: Two times a day (BID) | OTIC | 0 refills | Status: DC

## 2017-07-15 ENCOUNTER — Other Ambulatory Visit: Payer: Self-pay | Admitting: Family Medicine

## 2017-07-15 DIAGNOSIS — J208 Acute bronchitis due to other specified organisms: Secondary | ICD-10-CM

## 2017-07-19 ENCOUNTER — Telehealth: Payer: Self-pay | Admitting: *Deleted

## 2017-07-19 MED ORDER — AEROCHAMBER PLUS FLO-VU MEDIUM MISC: 1.0000 | Freq: Once | 1 refills | Status: AC

## 2017-07-19 NOTE — Telephone Encounter (Signed)
Patient aware.

## 2017-07-19 NOTE — Telephone Encounter (Signed)
spacer Rx sent in, for COPD.   Murtis Sink, MD [facility] 07/19/2017, 10:23 AM

## 2017-07-19 NOTE — Telephone Encounter (Signed)
Fax received [facility] requested new Rx for spacer for Albuterol Inhaler Please advise

## 2017-08-04 ENCOUNTER — Other Ambulatory Visit: Payer: Self-pay | Admitting: Family Medicine

## 2017-08-09 ENCOUNTER — Other Ambulatory Visit: Payer: Self-pay | Admitting: Physician Assistant

## 2017-08-09 ENCOUNTER — Telehealth: Payer: Self-pay | Admitting: Family Medicine

## 2017-08-09 ENCOUNTER — Ambulatory Visit (INDEPENDENT_AMBULATORY_CARE_PROVIDER_SITE_OTHER): Payer: Medicare Other | Admitting: Physician Assistant

## 2017-08-09 ENCOUNTER — Encounter: Payer: Self-pay | Admitting: Physician Assistant

## 2017-08-09 VITALS — BP 97/63 | HR 81 | Temp 97.1°F | Ht 65.0 in | Wt 130.6 lb

## 2017-08-09 DIAGNOSIS — N3001 Acute cystitis with hematuria: Secondary | ICD-10-CM

## 2017-08-09 DIAGNOSIS — R300 Dysuria: Secondary | ICD-10-CM | POA: Diagnosis not present

## 2017-08-09 DIAGNOSIS — Z87828 Personal history of other (healed) physical injury and trauma: Secondary | ICD-10-CM | POA: Diagnosis not present

## 2017-08-09 LAB — URINALYSIS, COMPLETE
BILIRUBIN UA: NEGATIVE
Glucose, UA: NEGATIVE
KETONES UA: NEGATIVE
NITRITE UA: POSITIVE — AB
SPEC GRAV UA: 1.01 (ref 1.005–1.030)
Urobilinogen, Ur: 0.2 mg/dL (ref 0.2–1.0)
pH, UA: 7 (ref 5.0–7.5)

## 2017-08-09 LAB — MICROSCOPIC EXAMINATION
RENAL EPITHEL UA: NONE SEEN /HPF
WBC, UA: >30 /hpf — AB (ref 0–5)

## 2017-08-09 MED ORDER — AMOXICILLIN-POT CLAVULANATE 875-125 MG PO TABS: 1.0000 | ORAL_TABLET | Freq: Two times a day (BID) | ORAL | 0 refills | Status: DC

## 2017-08-09 MED ORDER — DESONIDE 0.05 % EX LOTN: TOPICAL_LOTION | Freq: Two times a day (BID) | CUTANEOUS | 0 refills | Status: DC

## 2017-08-09 MED ORDER — HYDROCORTISONE 0.5 % EX CREA: 1.0000 "application " | TOPICAL_CREAM | Freq: Two times a day (BID) | CUTANEOUS | 0 refills | Status: AC

## 2017-08-09 MED ORDER — CEFDINIR 300 MG PO CAPS: 300.0000 mg | ORAL_CAPSULE | Freq: Two times a day (BID) | ORAL | 0 refills | Status: DC

## 2017-08-09 NOTE — Telephone Encounter (Signed)
Sent lowest dose hydrocortisone, use thin amount pon lesion

## 2017-08-09 NOTE — Telephone Encounter (Signed)
Fax received Insurance prefers Triamcinolone, Mometasone, Flucoinolone, Betamethasone, valerate and Hydrocortisone Please advise
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
## 2017-08-09 NOTE — Telephone Encounter (Signed)
FYI, Medicaid will not pay for Desowen.  [facility] a sheet that lists what they will cover.  Should receive it soon.

## 2017-08-10 NOTE — Telephone Encounter (Signed)
Aware of recommendations  

## 2017-08-12 LAB — URINE CULTURE

## 2017-08-25 ENCOUNTER — Ambulatory Visit: Payer: Medicare Other | Admitting: *Deleted

## 2017-08-27 DIAGNOSIS — J0101 Acute recurrent maxillary sinusitis: Secondary | ICD-10-CM | POA: Diagnosis not present

## 2017-09-02 ENCOUNTER — Other Ambulatory Visit: Payer: Self-pay | Admitting: Family

## 2017-09-02 MED ORDER — LORAZEPAM 0.5 MG PO TABS: 0.5000 mg | ORAL_TABLET | Freq: Three times a day (TID) | ORAL | 2 refills | Status: DC | PRN

## 2017-09-02 NOTE — Progress Notes (Signed)
Ativan Prescription sent to pharmacy   

## 2017-09-03 ENCOUNTER — Other Ambulatory Visit: Payer: Self-pay | Admitting: Family Medicine

## 2017-09-05 ENCOUNTER — Encounter: Payer: Self-pay | Admitting: Family Medicine

## 2017-09-05 NOTE — Telephone Encounter (Signed)
Last seen 08/09/17  Lindsey May  Dr Ermalinda MemosBradshaw PCP

## 2017-09-14 ENCOUNTER — Other Ambulatory Visit: Payer: Self-pay | Admitting: Physician Assistant

## 2017-09-26 ENCOUNTER — Telehealth: Payer: Self-pay | Admitting: Family Medicine

## 2017-09-26 NOTE — Telephone Encounter (Signed)
appt scheduled Pt notified 

## 2017-09-27 ENCOUNTER — Ambulatory Visit: Payer: Medicare Other | Admitting: Family

## 2017-09-27 ENCOUNTER — Encounter: Payer: Self-pay | Admitting: Physician Assistant

## 2017-09-27 ENCOUNTER — Ambulatory Visit (INDEPENDENT_AMBULATORY_CARE_PROVIDER_SITE_OTHER): Payer: Medicare Other | Admitting: Physician Assistant

## 2017-09-27 ENCOUNTER — Ambulatory Visit (INDEPENDENT_AMBULATORY_CARE_PROVIDER_SITE_OTHER): Payer: Medicare Other

## 2017-09-27 VITALS — BP 106/66 | HR 79 | Temp 98.4°F | Ht 65.0 in | Wt 132.4 lb

## 2017-09-27 DIAGNOSIS — R300 Dysuria: Secondary | ICD-10-CM

## 2017-09-27 DIAGNOSIS — N3001 Acute cystitis with hematuria: Secondary | ICD-10-CM | POA: Diagnosis not present

## 2017-09-27 DIAGNOSIS — J0110 Acute frontal sinusitis, unspecified: Secondary | ICD-10-CM

## 2017-09-27 DIAGNOSIS — S4991XA Unspecified injury of right shoulder and upper arm, initial encounter: Secondary | ICD-10-CM | POA: Diagnosis not present

## 2017-09-27 DIAGNOSIS — M25511 Pain in right shoulder: Secondary | ICD-10-CM | POA: Diagnosis not present

## 2017-09-27 LAB — URINALYSIS, COMPLETE
BILIRUBIN UA: NEGATIVE
Glucose, UA: NEGATIVE
Ketones, UA: NEGATIVE
Nitrite, UA: POSITIVE — AB
PH UA: 5.5 (ref 5.0–7.5)
Protein, UA: NEGATIVE
Specific Gravity, UA: 1.01 (ref 1.005–1.030)
UUROB: 0.2 mg/dL (ref 0.2–1.0)

## 2017-09-27 LAB — MICROSCOPIC EXAMINATION: RENAL EPITHEL UA: NONE SEEN /HPF

## 2017-09-27 MED ORDER — ALBUTEROL SULFATE (2.5 MG/3ML) 0.083% IN NEBU: INHALATION_SOLUTION | RESPIRATORY_TRACT | 5 refills | Status: DC

## 2017-09-27 MED ORDER — AMOXICILLIN-POT CLAVULANATE 875-125 MG PO TABS: 1.0000 | ORAL_TABLET | Freq: Two times a day (BID) | ORAL | 0 refills | Status: AC

## 2017-09-27 MED ORDER — MECLIZINE HCL 25 MG PO TABS: ORAL_TABLET | ORAL | 0 refills | Status: DC

## 2017-09-27 MED ORDER — FLUCONAZOLE 150 MG PO TABS: 150.0000 mg | ORAL_TABLET | ORAL | 0 refills | Status: AC

## 2017-09-28 NOTE — Progress Notes (Signed)
BP 106/66   Pulse 79   Temp 98.4 F (36.9 C) (Oral)   Ht 5\' 5"  (1.651 m)   Wt 132 lb 6.4 oz (60.1 kg)   BMI 22.03 kg/m    Subjective:    Patient ID: Lindsey May, female    DOB: May 19, 1950, 67 y.o.   MRN: [id number]  HPI: Lindsey May is a 67 y.o. female presenting on 09/27/2017 for Shoulder Pain (right ) and Urinary Tract Infection This patient comes in for a couple of issues.  She feels that she is starting a urinary tract infection again.  She also feels that she has had significant sinus pressure and pain.  She denies any fever or chills.  There is copious drainage at times.  And recently she fell at her home.  She tripped over a wet spot on the floor.  The pain is great in her right shoulder.  She is trying to protect her right hip and leg and landed on her shoulder.  She had been using a sling and it did seem to help but she has lost it.exam she has been having them on a fairly regular basis the last one was greatly improved with Augmentin.  And the bacteria was sensitive to it.    She is concerned about the urinary tract infection just because of the smell.  She has very minimal symptoms when she has her infections now.  In looking back at her previous  Past Medical History:  Diagnosis Date  . Allergy   . Anxiety   . Arthritis   . COPD (chronic obstructive pulmonary disease) (HCC)   . Depression   . Fibromyalgia   . Osteoporosis   . Vertigo    Relevant past medical, surgical, family and social history reviewed and updated as indicated. Interim medical history since our last visit reviewed. Allergies and medications reviewed and updated. DATA REVIEWED: CHART IN EPIC  Family History reviewed for pertinent findings.  Review of Systems  Constitutional: Positive for fatigue. Negative for fever.  HENT: Positive for congestion, facial swelling, sinus pressure and sinus pain.   Eyes: Negative.   Respiratory: Negative.   Gastrointestinal: Negative.   Genitourinary:  Positive for difficulty urinating, dysuria and urgency. Negative for flank pain.  Musculoskeletal: Positive for arthralgias and joint swelling.    Allergies as of 09/27/2017      Reactions   Zithromax [azithromycin]    Codeine Other (See Comments)      Medication List        Accurate as of 09/27/17 11:59 PM. Always use your most recent med list.          amoxicillin-clavulanate 875-125 MG tablet Commonly known as:  AUGMENTIN Take 1 tablet by mouth 2 (two) times daily.   aspirin EC 81 MG tablet Take 1 tablet (81 mg total) by mouth daily.   DULoxetine 30 MG capsule Commonly known as:  CYMBALTA TAKE 1 CAPSULE (30 MG TOTAL) BY MOUTH DAILY.   fluconazole 150 MG tablet Commonly known as:  DIFLUCAN Take 1 tablet (150 mg total) by mouth once a week.   fluticasone 50 MCG/ACT nasal spray Commonly known as:  FLONASE Place 2 sprays into both nostrils daily.   Fluticasone-Salmeterol 500-50 MCG/DOSE Aepb Commonly known as:  ADVAIR DISKUS Inhale 1 puff into the lungs 2 (two) times daily.   hydrocortisone cream 0.5 % Apply 1 application topically 2 (two) times daily. Apply thin amount   LORazepam 0.5 MG tablet Commonly known as:  ATIVAN Take 1 tablet (0.5 mg total) by mouth every 8 (eight) hours as needed.   meclizine 25 MG tablet Commonly known as:  ANTIVERT TAKE 1 TABLET (25 MG TOTAL) BY MOUTH 3 (THREE) TIMES DAILY AS NEEDED FOR DIZZINESS.   MULTI-VITAMIN/IRON PO Take 1 tablet by mouth daily.   nystatin cream Commonly known as:  MYCOSTATIN APPLY 1 APPLICATION TOPICALLY AT BEDTIME.   ofloxacin 0.3 % OTIC solution Commonly known as:  FLOXIN PLACE 5 DROPS INTO BOTH EARS 2 (TWO) TIMES DAILY.   rOPINIRole 0.25 MG tablet Commonly known as:  REQUIP 1-2 tabs at bedtime   VENTOLIN HFA 108 (90 Base) MCG/ACT inhaler Generic drug:  albuterol INHALE 2 PUFFS INTO THE LUNGS EVERY 6 (SIX) HOURS AS NEEDED FOR WHEEZING OR SHORTNESS OF BREATH.   albuterol (2.5 MG/3ML) 0.083%  nebulizer solution Commonly known as:  PROVENTIL USE ONE VIAL IN NEBULIZER EVERY 6 HOURS AS NEEDED FOR WHEEZING OR SHORTNESS OF BREATH   Vitamin D (Cholecalciferol) 1000 units Tabs Take 1,000 Units by mouth daily.          Objective:    BP 106/66   Pulse 79   Temp 98.4 F (36.9 C) (Oral)   Ht 5\' 5"  (1.651 m)   Wt 132 lb 6.4 oz (60.1 kg)   BMI 22.03 kg/m   Allergies  Allergen Reactions  . Zithromax [Azithromycin]   . Codeine Other (See Comments)    Wt Readings from Last 3 Encounters:  09/27/17 132 lb 6.4 oz (60.1 kg)  08/09/17 130 lb 9.6 oz (59.2 kg)  07/05/17 133 lb 3.2 oz (60.4 kg)    Physical Exam  Constitutional: She is oriented to person, place, and time. She appears well-developed and well-nourished.  HENT:  Head: Normocephalic and atraumatic.  Right Ear: Tympanic membrane and external ear normal. No middle ear effusion.  Left Ear: Tympanic membrane and external ear normal.  No middle ear effusion.  Nose: Mucosal edema and rhinorrhea present. Right sinus exhibits no maxillary sinus tenderness. Left sinus exhibits no maxillary sinus tenderness.  Mouth/Throat: Uvula is midline. Posterior oropharyngeal erythema present.  Eyes: Pupils are equal, round, and reactive to light. Conjunctivae and EOM are normal. Right eye exhibits no discharge. Left eye exhibits no discharge.  Neck: Normal range of motion.  Cardiovascular: Normal rate, regular rhythm, normal heart sounds and intact distal pulses.  Pulmonary/Chest: Effort normal and breath sounds normal. No respiratory distress. She has no wheezes.  Abdominal: Soft. Bowel sounds are normal. She exhibits no distension and no mass. There is tenderness in the suprapubic area. There is no rebound, no guarding and no CVA tenderness.  Musculoskeletal:       Right shoulder: She exhibits decreased range of motion, tenderness and swelling.  Lymphadenopathy:    She has no cervical adenopathy.  Neurological: She is alert and oriented  to person, place, and time. She has normal reflexes.  Skin: Skin is warm and dry. No rash noted.  Psychiatric: She has a normal mood and affect. Her behavior is normal. Judgment and thought content normal.    Results for orders placed or performed in visit on 09/27/17  Microscopic Examination  Result Value Ref Range   WBC, UA 0-5 0 - 5 /hpf   RBC, UA 0-2 0 - 2 /hpf   Epithelial Cells (non renal) 0-10 0 - 10 /hpf   Renal Epithel, UA None seen None seen /hpf   Bacteria, UA Many (A) None seen/Few   Yeast, UA Present None seen  Urinalysis, Complete  Result Value Ref Range   Specific Gravity, UA 1.010 1.005 - 1.030   pH, UA 5.5 5.0 - 7.5   Color, UA Yellow Yellow   Appearance Ur Clear Clear   Leukocytes, UA Trace (A) Negative   Protein, UA Negative Negative/Trace   Glucose, UA Negative Negative   Ketones, UA Negative Negative   RBC, UA Trace (A) Negative   Bilirubin, UA Negative Negative   Urobilinogen, Ur 0.2 0.2 - 1.0 mg/dL   Nitrite, UA Positive (A) Negative   Microscopic Examination See below:       Assessment & Plan:   1. Dysuria - Urine Culture - Urinalysis, Complete - DG Shoulder Right; Future  2. Injury of right shoulder, initial encounter - DG Shoulder Right; Future  3. Acute non-recurrent frontal sinusitis Augmentin 875 1 twice daily 10 days  4. Acute cystitis with hematuria Augmentin 875 1 twice daily 10 days   Continue all other maintenance medications as listed above.  Follow up plan: Return in about 2 weeks (around 10/11/2017) for recheck. After returning in 2 weeks if her shoulder still bothering her we will plan for an intra-articular injection with Dr. Nadine Counts.  Educational handout given for survey  Remus Loffler PA-C [facility] [address]  [place], [place] [address] [phone]   09/28/2017, 1:42 PM

## 2017-09-29 LAB — URINE CULTURE

## 2017-10-17 ENCOUNTER — Telehealth: Payer: Self-pay | Admitting: Physician Assistant

## 2017-10-17 NOTE — Telephone Encounter (Signed)
Patient aware that we need to repeat urine.

## 2017-10-20 ENCOUNTER — Ambulatory Visit: Payer: Medicare Other | Admitting: Physician Assistant

## 2017-10-23 DIAGNOSIS — J329 Chronic sinusitis, unspecified: Secondary | ICD-10-CM | POA: Diagnosis not present

## 2017-10-23 DIAGNOSIS — N390 Urinary tract infection, site not specified: Secondary | ICD-10-CM | POA: Diagnosis not present

## 2017-10-23 DIAGNOSIS — Z87891 Personal history of nicotine dependence: Secondary | ICD-10-CM | POA: Diagnosis not present

## 2017-10-23 DIAGNOSIS — R309 Painful micturition, unspecified: Secondary | ICD-10-CM | POA: Diagnosis not present

## 2017-10-23 DIAGNOSIS — Z79899 Other long term (current) drug therapy: Secondary | ICD-10-CM | POA: Diagnosis not present

## 2017-11-30 ENCOUNTER — Other Ambulatory Visit: Payer: Self-pay | Admitting: Family

## 2017-12-05 ENCOUNTER — Other Ambulatory Visit: Payer: Self-pay | Admitting: Family

## 2017-12-05 ENCOUNTER — Other Ambulatory Visit: Payer: Self-pay | Admitting: Physician Assistant

## 2017-12-05 NOTE — Telephone Encounter (Signed)
Last seen 09/27/17   Angel 

## 2017-12-12 DIAGNOSIS — R309 Painful micturition, unspecified: Secondary | ICD-10-CM | POA: Diagnosis not present

## 2017-12-12 DIAGNOSIS — N390 Urinary tract infection, site not specified: Secondary | ICD-10-CM | POA: Diagnosis not present

## 2017-12-23 ENCOUNTER — Telehealth: Payer: Self-pay | Admitting: Physician Assistant

## 2017-12-23 ENCOUNTER — Other Ambulatory Visit: Payer: Self-pay | Admitting: Family Medicine

## 2017-12-23 MED ORDER — LORAZEPAM 0.5 MG PO TABS: 0.5000 mg | ORAL_TABLET | Freq: Three times a day (TID) | ORAL | 0 refills | Status: DC | PRN

## 2017-12-23 NOTE — Telephone Encounter (Signed)
She can call her pharmacy to have transferred.

## 2017-12-23 NOTE — Telephone Encounter (Signed)
[facility] pharmacy

## 2017-12-23 NOTE — Progress Notes (Signed)
The Narcotic Database has been reviewed.  There were no red flags.  Last fill September.

## 2017-12-23 NOTE — Telephone Encounter (Signed)
Patient aware.

## 2017-12-23 NOTE — Telephone Encounter (Signed)
Spoke with pharmacist at [facility].  Pharmacist states that since prescription has been transferred to [facility] pharmacy once before they can not transfer again.  Will need a new Rx sent to [facility] and the one at [facility] will be deactivated.

## 2017-12-23 NOTE — Telephone Encounter (Signed)
I called pharmacy.  12/05/17 rx never filled.  The brand new rx will be cancelled and a new rx sent to [facility] pharmacy w. NO refills.  Further fills per pcp.

## 2017-12-23 NOTE — Telephone Encounter (Signed)
Patient is out of town due to being in court. She needs [place] to call and give authorization to call Ativan into [facility] [place]. Please call patient.

## 2017-12-23 NOTE — Telephone Encounter (Signed)
Patient aware refill was sent to pharmacy on 12/05/2017.  Patient states that she will need authorization to have medication sent to [facility] pharmacy in [place].  Patient states that she is in [place] due to court.

## 2018-01-04 ENCOUNTER — Telehealth: Payer: Self-pay | Admitting: Physician Assistant

## 2018-01-04 DIAGNOSIS — J441 Chronic obstructive pulmonary disease with (acute) exacerbation: Secondary | ICD-10-CM

## 2018-01-04 NOTE — Telephone Encounter (Signed)
PT states her Nebulizer has went out and wants [place] to send a rx for one to  [facility] [phone] phone number)

## 2018-01-04 NOTE — Telephone Encounter (Signed)
Can you get an order ready for this, or do I need to just put one in?

## 2018-01-05 ENCOUNTER — Telehealth: Payer: Self-pay | Admitting: Physician Assistant

## 2018-01-05 NOTE — Telephone Encounter (Signed)
Pt aware script will be faxed today

## 2018-01-05 NOTE — Telephone Encounter (Signed)
Discussed with Lindsey May, she is sending Rx today. Pt aware

## 2018-01-20 ENCOUNTER — Other Ambulatory Visit: Payer: Self-pay | Admitting: *Deleted

## 2018-01-20 ENCOUNTER — Other Ambulatory Visit: Payer: Self-pay | Admitting: Physician Assistant

## 2018-01-20 MED ORDER — LORAZEPAM 0.5 MG PO TABS: 0.5000 mg | ORAL_TABLET | Freq: Three times a day (TID) | ORAL | 3 refills | Status: AC | PRN

## 2018-01-20 NOTE — Telephone Encounter (Signed)
Pt needing RF on her Lorazepam says the weather going back & forth is bothering her and she is going through something in mediation. Appt has been made for 02/14/18 [facility]

## 2018-01-20 NOTE — Telephone Encounter (Signed)
sent 

## 2018-01-23 NOTE — Telephone Encounter (Signed)
Pt aware and is going to pick up rx.

## 2018-01-30 DIAGNOSIS — J449 Chronic obstructive pulmonary disease, unspecified: Secondary | ICD-10-CM | POA: Diagnosis not present

## 2018-01-30 DIAGNOSIS — J329 Chronic sinusitis, unspecified: Secondary | ICD-10-CM | POA: Diagnosis not present

## 2018-02-14 ENCOUNTER — Ambulatory Visit: Payer: Medicare Other | Admitting: Physician Assistant

## 2018-02-15 ENCOUNTER — Encounter: Payer: Self-pay | Admitting: Physician Assistant

## 2018-05-22 ENCOUNTER — Other Ambulatory Visit: Payer: Self-pay | Admitting: *Deleted

## 2018-05-22 MED ORDER — ROPINIROLE HCL 0.25 MG PO TABS: ORAL_TABLET | ORAL | 1 refills | Status: AC

## 2018-06-22 ENCOUNTER — Other Ambulatory Visit: Payer: Self-pay | Admitting: *Deleted

## 2018-06-22 MED ORDER — ALBUTEROL SULFATE (2.5 MG/3ML) 0.083% IN NEBU: 2.5000 mg | INHALATION_SOLUTION | Freq: Four times a day (QID) | RESPIRATORY_TRACT | 0 refills | Status: DC | PRN

## 2018-06-22 MED ORDER — MECLIZINE HCL 25 MG PO TABS: 25.0000 mg | ORAL_TABLET | Freq: Two times a day (BID) | ORAL | 1 refills | Status: AC | PRN

## 2018-06-22 NOTE — Addendum Note (Signed)
Addended by: Julious Payer D on: 06/22/2018 09:36 AM   Modules accepted: Orders

## 2018-06-27 ENCOUNTER — Telehealth: Payer: Self-pay | Admitting: *Deleted

## 2018-06-27 MED ORDER — ALBUTEROL SULFATE (2.5 MG/3ML) 0.083% IN NEBU: 2.5000 mg | INHALATION_SOLUTION | Freq: Four times a day (QID) | RESPIRATORY_TRACT | 0 refills | Status: DC | PRN

## 2018-06-27 NOTE — Telephone Encounter (Signed)
Fax from [facility] [place] Needing ICD-10 for Albuterol [employer]

## 2018-08-28 ENCOUNTER — Other Ambulatory Visit: Payer: Self-pay | Admitting: Physician Assistant

## 2018-10-05 ENCOUNTER — Other Ambulatory Visit: Payer: Self-pay | Admitting: Physician Assistant

## 2018-10-06 NOTE — Telephone Encounter (Signed)
Denied, needs appointment

## 2019-08-12 IMAGING — DX DG FOOT COMPLETE 3+V*R*
3 series · 3 of 3 positions shown · non-contrast
Comparison: None.

CLINICAL DATA: 66-year-old female with a history of lateral foot
pain without a known injury

EXAM:
RIGHT FOOT COMPLETE - 3+ VIEW

[foot ap]
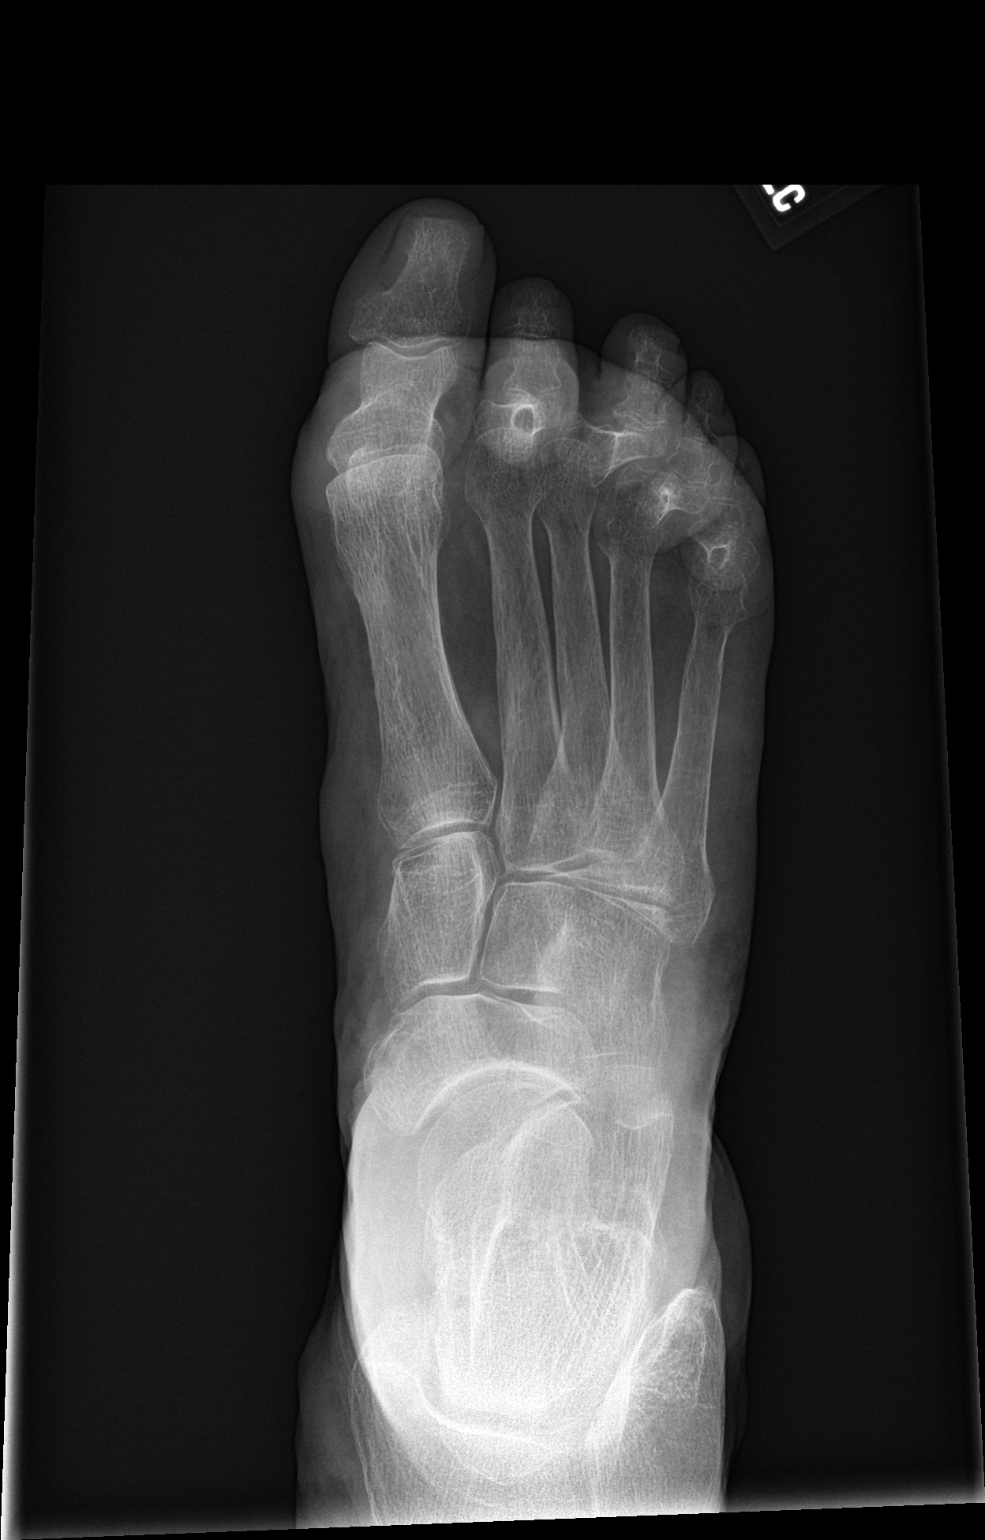

[foot obl]
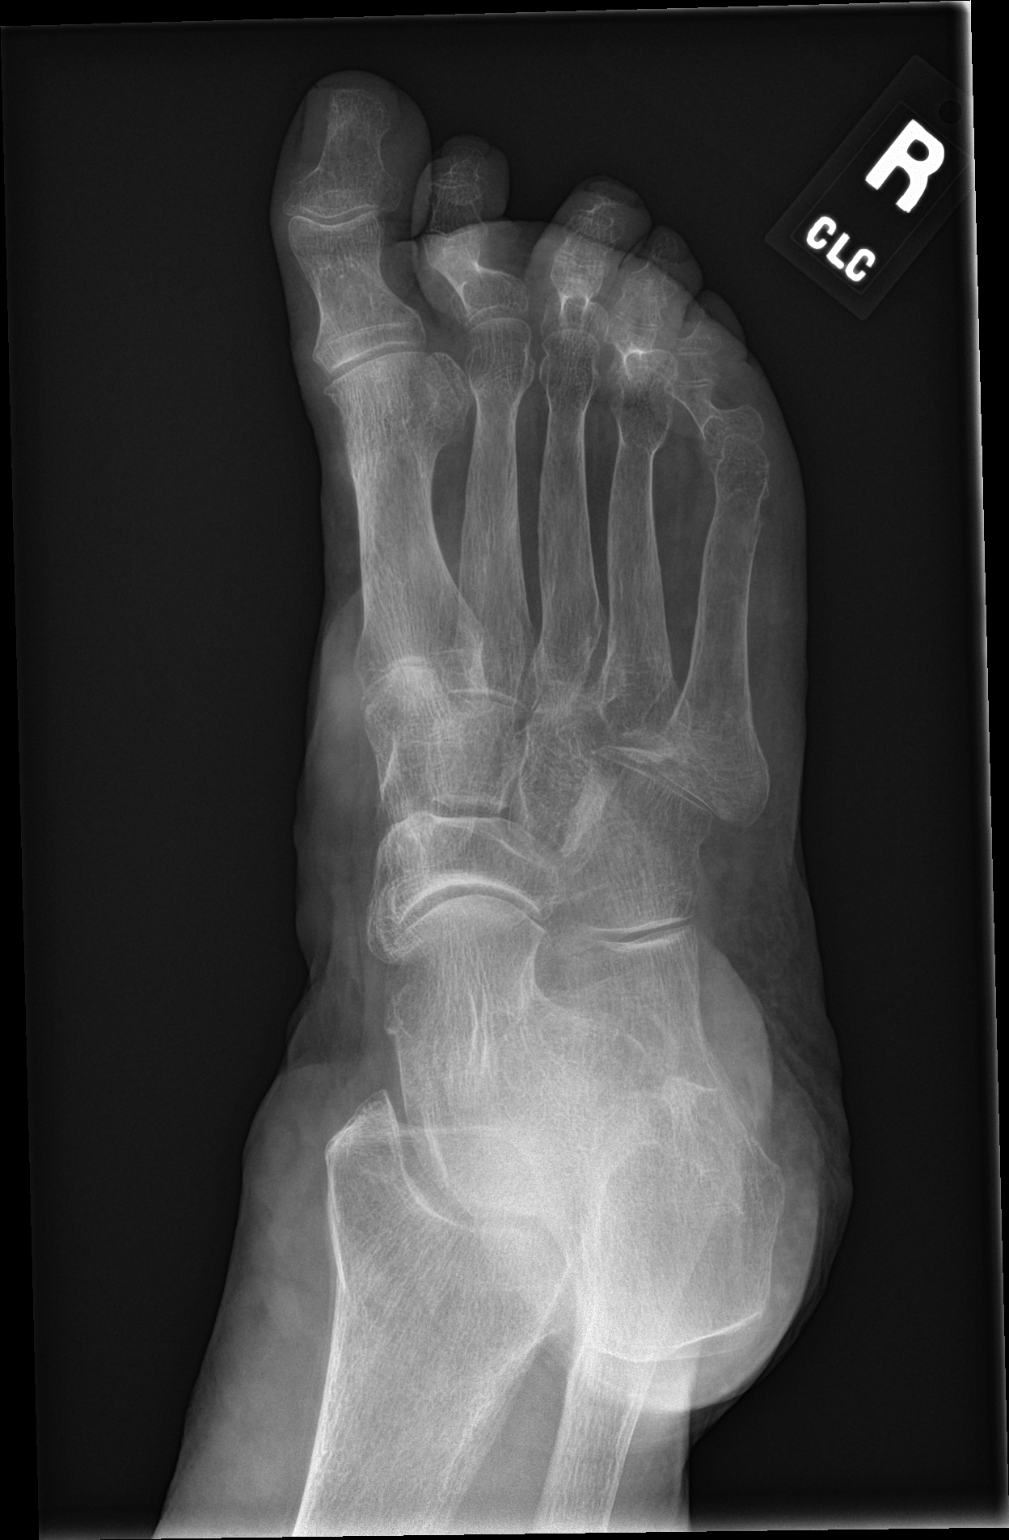

[foot lat]
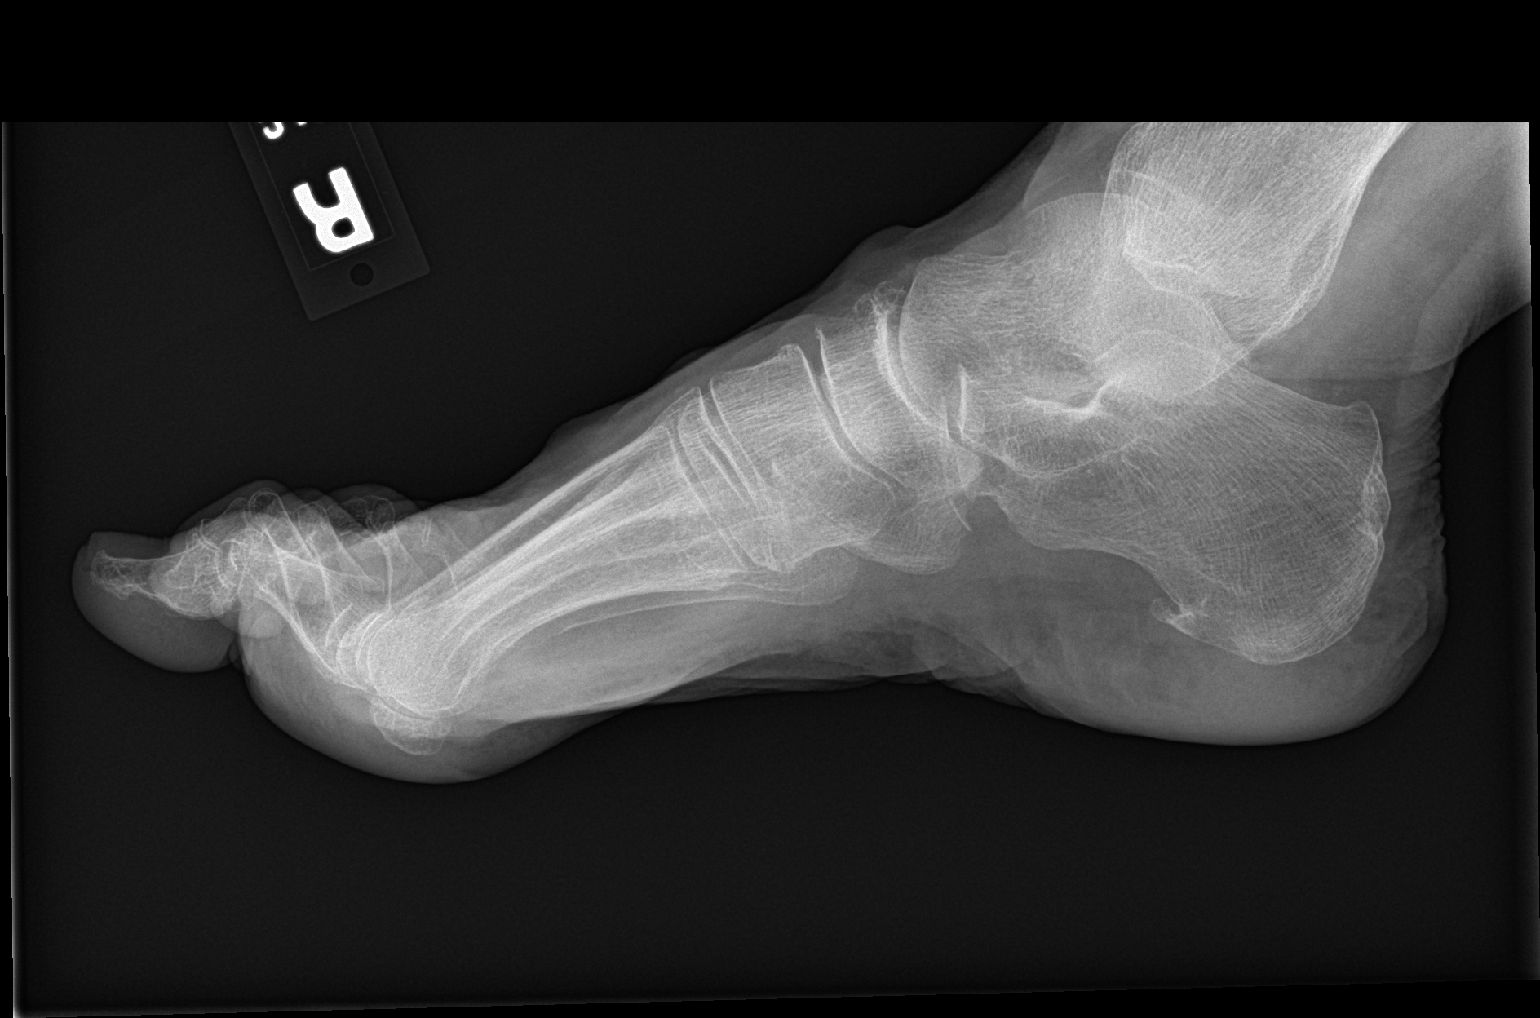

[3 of 3 positions shown; findings below may reference images not displayed]

FINDINGS: Osteopenia. No acute displaced fracture. No focal soft tissue
swelling. No radiopaque foreign body. Degenerative changes of the
interphalangeal joints, midfoot, hindfoot.
IMPRESSION: Negative for acute bony abnormality.

Osteopenia.

## 2021-03-01 DEATH — deceased
